# Patient Record
Sex: Female | Born: 1965
Health system: Southern US, Community
[De-identification: ages and names within clinical notes are randomized; demographics above are authoritative.]

## PROBLEM LIST (undated history)

## (undated) DIAGNOSIS — Z800 Family history of malignant neoplasm of digestive organs: Secondary | ICD-10-CM

## (undated) DIAGNOSIS — E7800 Pure hypercholesterolemia, unspecified: Secondary | ICD-10-CM

## (undated) DIAGNOSIS — I10 Essential (primary) hypertension: Secondary | ICD-10-CM

## (undated) DIAGNOSIS — K219 Gastro-esophageal reflux disease without esophagitis: Secondary | ICD-10-CM

## (undated) DIAGNOSIS — I2481 Acute coronary microvascular dysfunction: Secondary | ICD-10-CM

## (undated) DIAGNOSIS — R072 Precordial pain: Secondary | ICD-10-CM

## (undated) DIAGNOSIS — E785 Hyperlipidemia, unspecified: Secondary | ICD-10-CM

## (undated) DIAGNOSIS — Z9889 Other specified postprocedural states: Secondary | ICD-10-CM

## (undated) DIAGNOSIS — R112 Nausea with vomiting, unspecified: Secondary | ICD-10-CM

## (undated) HISTORY — DX: Essential (primary) hypertension: I10

## (undated) HISTORY — PX: TOTAL ABDOMINAL HYSTERECTOMY: SHX209

## (undated) HISTORY — PX: INCONTINENCE SURGERY: SHX676

## (undated) HISTORY — PX: ENDOMETRIAL ABLATION: SHX621

## (undated) HISTORY — PX: TUBAL LIGATION: SHX77

## (undated) HISTORY — DX: Family history of malignant neoplasm of digestive organs: Z80.0

## (undated) HISTORY — DX: Hyperlipidemia, unspecified: E78.5

---

## 1998-02-23 ENCOUNTER — Ambulatory Visit (HOSPITAL_COMMUNITY): Admission: RE | Admit: 1998-02-23 | Discharge: 1998-02-23 | Payer: Self-pay | Admitting: *Deleted

## 1998-03-01 ENCOUNTER — Inpatient Hospital Stay (HOSPITAL_COMMUNITY): Admission: RE | Admit: 1998-03-01 | Discharge: 1998-03-01 | Payer: Self-pay | Admitting: *Deleted

## 2000-01-22 ENCOUNTER — Encounter: Payer: Self-pay | Admitting: Family Medicine

## 2000-01-22 ENCOUNTER — Encounter: Admission: RE | Admit: 2000-01-22 | Discharge: 2000-01-22 | Payer: Self-pay | Admitting: Family Medicine

## 2000-10-12 ENCOUNTER — Encounter: Payer: Self-pay | Admitting: Family Medicine

## 2000-10-12 ENCOUNTER — Encounter: Admission: RE | Admit: 2000-10-12 | Discharge: 2000-10-12 | Payer: Self-pay | Admitting: Family Medicine

## 2000-10-14 ENCOUNTER — Encounter: Payer: Self-pay | Admitting: Family Medicine

## 2000-10-14 ENCOUNTER — Encounter: Admission: RE | Admit: 2000-10-14 | Discharge: 2000-10-14 | Payer: Self-pay | Admitting: Family Medicine

## 2001-04-09 ENCOUNTER — Emergency Department (HOSPITAL_COMMUNITY): Admission: EM | Admit: 2001-04-09 | Discharge: 2001-04-09 | Payer: Self-pay | Admitting: Emergency Medicine

## 2001-04-09 ENCOUNTER — Encounter: Payer: Self-pay | Admitting: Emergency Medicine

## 2001-06-19 ENCOUNTER — Encounter: Payer: Self-pay | Admitting: Neurology

## 2001-06-19 ENCOUNTER — Encounter: Admission: RE | Admit: 2001-06-19 | Discharge: 2001-06-19 | Payer: Self-pay | Admitting: Neurology

## 2003-03-06 ENCOUNTER — Emergency Department (HOSPITAL_COMMUNITY): Admission: EM | Admit: 2003-03-06 | Discharge: 2003-03-06 | Payer: Self-pay | Admitting: *Deleted

## 2003-09-01 ENCOUNTER — Ambulatory Visit (HOSPITAL_COMMUNITY): Admission: RE | Admit: 2003-09-01 | Discharge: 2003-09-01 | Payer: Self-pay | Admitting: Family Medicine

## 2003-09-01 ENCOUNTER — Encounter: Payer: Self-pay | Admitting: Family Medicine

## 2004-01-29 ENCOUNTER — Ambulatory Visit (HOSPITAL_COMMUNITY): Admission: RE | Admit: 2004-01-29 | Discharge: 2004-01-29 | Payer: Self-pay | Admitting: Gastroenterology

## 2005-10-13 ENCOUNTER — Other Ambulatory Visit: Admission: RE | Admit: 2005-10-13 | Discharge: 2005-10-13 | Payer: Self-pay | Admitting: Obstetrics and Gynecology

## 2005-12-01 ENCOUNTER — Encounter (INDEPENDENT_AMBULATORY_CARE_PROVIDER_SITE_OTHER): Payer: Self-pay | Admitting: *Deleted

## 2005-12-01 ENCOUNTER — Inpatient Hospital Stay (HOSPITAL_COMMUNITY): Admission: RE | Admit: 2005-12-01 | Discharge: 2005-12-03 | Payer: Self-pay | Admitting: Obstetrics and Gynecology

## 2007-03-15 ENCOUNTER — Encounter (INDEPENDENT_AMBULATORY_CARE_PROVIDER_SITE_OTHER): Payer: Self-pay | Admitting: *Deleted

## 2007-03-15 ENCOUNTER — Ambulatory Visit (HOSPITAL_COMMUNITY): Admission: RE | Admit: 2007-03-15 | Discharge: 2007-03-16 | Payer: Self-pay | Admitting: Obstetrics and Gynecology

## 2008-07-06 ENCOUNTER — Ambulatory Visit (HOSPITAL_COMMUNITY): Admission: RE | Admit: 2008-07-06 | Discharge: 2008-07-06 | Payer: Self-pay | Admitting: Gastroenterology

## 2011-05-16 NOTE — Op Note (Signed)
NAME:  Tricia Atkins, Tricia Atkins                        ACCOUNT NO.:  [id number]   MEDICAL RECORD NO.:  [id number]                   PATIENT TYPE:  AMB   LOCATION:  ENDO                                 FACILITY:  [facility]   PHYSICIAN:  Anselmo Rod, M.D.               DATE OF BIRTH:  September 01, 1966   DATE OF PROCEDURE:  01/29/2004  DATE OF DISCHARGE:                                 OPERATIVE REPORT   PROCEDURE PERFORMED:  Esophagogastroduodenoscopy.   ENDOSCOPIST:  Charna Elizabeth, M.D.   INSTRUMENT USED:  Olympus video panendoscope.   INDICATIONS FOR PROCEDURE:  Abdominal pain with change in bowel habits in a  45 year old white female with a family history of stomach cancer, rule out  peptic ulcer disease, polyps, masses, etc.   PREPROCEDURE PREPARATION:  Informed consent was procured from the patient.  The patient was fasted for eight hours prior to the procedure.   PREPROCEDURE PHYSICAL:  The patient had stable vital signs.  Neck supple,  chest clear to auscultation.  S1, S2 regular.  Abdomen soft with normal  bowel sounds.   DESCRIPTION OF PROCEDURE:  The patient was placed in the left lateral  decubitus position and sedated with 70 mg of Demerol and 7 mg of Versed  intravenously.  Once the patient was adequately sedated and maintained on  low-flow oxygen and continuous cardiac monitoring, the Olympus video  panendoscope was advanced through the mouth piece over the tongue into the  esophagus under direct vision.  The entire esophagus appeared normal with no  evidence of ring, stricture, masses, esophagitis or Barrett's mucosa.  The  scope was then advanced to the stomach.  The entire gastric mucosa of the  proximal small bowel appeared normal.   IMPRESSION:  Normal esophagogastroduodenoscopy.   RECOMMENDATIONS:  Proceed with colonoscopy at this time, further  recommendation will be made thereafter.                                               Anselmo Rod, M.D.    JNM/MEDQ  D:   01/29/2004  T:  01/29/2004  Job:  [id number]   cc:   Marcelino Duster L. Vincente Poli, M.D.  [address], [address] [place]  [place] [address]  Fax: [phone]   Elvina Sidle, M.D.  [address] [place]  [place] [address]  Fax: [phone]

## 2011-05-16 NOTE — Op Note (Signed)
Tricia Atkins, Tricia Atkins              ACCOUNT NO.:  [id number]   MEDICAL RECORD NO.:  [id number]          PATIENT TYPE:  AMB   LOCATION:  SDC                           [facility]   PHYSICIAN:  Michelle L. Grewal, M.D.DATE OF BIRTH:  1966-02-10   DATE OF PROCEDURE:  03/15/2007  DATE OF DISCHARGE:                               OPERATIVE REPORT   PREOPERATIVE DIAGNOSIS:  Pelvic pain and ovarian cyst.   POSTOPERATIVE DIAGNOSIS:  Pelvic pain and ovarian cyst.   PROCEDURE:  Diagnostic laparoscopy, lysis of adhesions and bilateral  salpingo-oophorectomy.   SURGEON:  Dr. Vincente Poli.   ANESTHESIA:  General.   SPECIMENS:   OVARIES AND TUBES ESTIMATED:  Blood loss was minimal.   COMPLICATIONS:  None.   PROCEDURE:  Patient was taken to the operating room.  She is intubated  without difficulty.  She is prepped and draped in usual sterile fashion.  An in-and-out catheter was used to empty the bladder.  Speculum was  inserted.  A sponge stick was inserted to the vagina.  Attention was  turned to the abdomen, and a small infraumbilical incision was made, and  a Veress needle was inserted.  The pneumoperitoneum was achieved.  The  Veress needle was removed, and the 11-mm trocar was inserted.  The  patient was placed in Trendelenburg position.  The laparoscope was  introduced through the trocar sheath.  A secondary 11-mm trocar was  inserted suprapubically under direct visualization.  Exam of the abdomen  and pelvis revealed a normal appendix, surgically absent uterus.  No  evidence of endometriosis.  She had some adhesions of the omentum to the  vaginal cuff that were then clipped.  Her ovaries had some filmy  adhesions to the pelvic sidewall.  I then identified the left tube and  ovary.  The left ovary was lifted up with a grasper.  The ureter was  well below our IP ligament.  Used the gyrus to burn the IP ligament just  below the ovarian base and carry that all the way across the pedicle.  This was done with no bleeding whatsoever.  This was done in a similar  fashion on the right side as well.  After both tubes and ovaries were  removed, both ureters were noted to be peristalsing normally.  The  Endobag was inserted, and the two surgical specimens were scooped into  the bag, and then this was removed through the suprapubic port.  Entire  bag and specimens were removed.  Tissue was sent to pathology.  No  bleeding was noted whatsoever.  The pneumoperitoneum was released.  The  incisions were closed; the deep layer with a stitch of 0 Vicryl at each  incision site; and the incisions were closed with Dermabond skin  adhesive.  All sponge, lap and instrument counts were correct x2.  The  patient was extubated and went to recovery room in stable condition.      Michelle L. Vincente Poli, M.D.  Electronically Signed     MLG/MEDQ  D:  03/15/2007  T:  03/15/2007  Job:  [id number]

## 2011-05-16 NOTE — Op Note (Signed)
NAMESHALEAH, NISSLEY              ACCOUNT NO.:  [id number]   MEDICAL RECORD NO.:  [id number]          PATIENT TYPE:  INP   LOCATION:  NA                            [facility]   PHYSICIAN:  Michelle L. 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   PROCEDURE:  Total vaginal hysterectomy, anterior and posterior repair.   SURGEON:  Dr. Vincente Poli   ASSISTANT:  Dr. Rana Snare.   ANESTHESIA:  General.   ESTIMATED BLOOD LOSS:  200 mL.   The patient is taken to the operating room.  She is intubated without  difficulty and placed in the lithotomy position.  She is prepped and draped  in the usual sterile fashion, and an in-and-out Foley catheter is inserted.  Exam under anesthesia revealed a grade 2 uterine prolapse, grade 2  cystocele, and grade 2 rectocele.  A weighted speculum was placed in the  vagina.  The cervix was grasped with a single-tooth tenaculum. and a  circumferential incision is made around the cervix.  The overlying vaginal  epithelium is dissected free using Mayo scissors, and the posterior cul-de-  sac is entered sharply.  Using a curved Heaney clamp, uterosacral ligaments  are clamped on either side.  Each pedicle is cut and suture ligated using O  Vicryl suture.  We then entered anteriorly and then with the Metzenbaum  scissors, I worked my way up along the broad ligament using curved Heaney  clamps.  Each pedicle was secured and suture ligated using 0 Vicryl suture.  Once we reached the level of the triple pedicle, the uterus was retroflexed.  The ovaries were noted to be normal.  A curved Heaney clamp was placed  across each triple pedicle.  The specimens were removed using Mayo scissors.  Each  pedicle was secured using both a suture ligature of 0 Vicryl suture and  a free tie of 0 Vicryl suture.  I inspected the pedicles.  Hemostasis was  noted.  We then closed the posterior cuff in a continuous running locked  stitch using 0 Vicryl suture and then closed the posterior two-thirds of the  cuff using 0 Vicryl suture in a continuous running stitch.  At this point,  we decided to do the anterior repair.  I placed Allis clamps at either  corner of the vaginal apex and then using Struhle scissors, I made a midline  incision in the vaginal epithelium just beneath the bladder, almost all the  way to the UV junction.  We then dissected the vesicovaginal fascia free as  overlying vaginal epithelium.  I then closed and reduced the cystocele using  2 pursestring sutures using 0 Vicryl suture, trimmed off the redundant  vaginal epithelium, and closed it using a continuous running locked stitch  using 2-0 Vicryl suture, and it met with our previous suture line.  At this  point,  hemostasis was excellent.  We then placed Allis clamps at the  perineum at 5 and 7 o'clock, made a V-shaped incision, and worked our way  all the way up the posterior vaginal wall using Struhle scissors.  We then  reduced the rectocele, reapproximated the rectovaginal fascia in the midline  using interrupteds using 0 Vicryl suture, starting distally and moving  proximally, trimmed off the excess vaginal epithelium, and closed the  incisions using 2-0 Vicryl suture in a continuous running lock stitch.  At  this point, all sponge, lap, and instrument counts were correct x2.  Hemostasis was excellent.  I scrubbed out as well as Dr. Rana Snare, and Dr.  Sherron Monday, the urologist, scrubbed in, and the remainder of the surgery  will be dictated by him.   PATHOLOGY:  Uterus and cervix.   DRAINS:  Foley and vaginal pack.      Michelle L. Vincente Poli, M.D.  Electronically Signed     MLG/MEDQ  D:  12/01/2005  T:  12/01/2005  Job:   [id number]

## 2011-05-16 NOTE — Op Note (Signed)
[name], Tricia Atkins              ACCOUNT NO.:  [id number]   MEDICAL RECORD NO.:  [id number]          PATIENT TYPE:  INP   LOCATION:  NA                            [facility]   PHYSICIAN:  Martina Sinner, MD DATE OF BIRTH:  May 15, 1966   DATE OF PROCEDURE:  12/01/2005  DATE OF DISCHARGE:                                 OPERATIVE REPORT   PREOPERATIVE DIAGNOSIS:  Mixed stress and urge incontinence.   POSTOPERATIVE DIAGNOSIS:  Mixed stress and urge incontinence.   OPERATION/PROCEDURE:  SPARC sling cystourethropexy plus cystoscopy.   INDICATIONS:  Shine Scrogham has mixed stress urge incontinence.  Her stress  incontinence was problematic.  She underwent a vaginal hysterectomy and A&P  repair by Dr. Vincente Poli.  At the end of the case, I performed a sling  cystourethropexy.   DESCRIPTION OF PROCEDURE:  The patient was in the lithotomy position when I  entered the room.  I initially cystoscoped the patient with the 25-degree  and 70-degree lenses.  There was no distortion of the trigone or ureteral  orifice.  There was indentation in the floor of the bladder which is typical  following an anterior repair.  There was no bladder injury.   Two 1 cm incision were made one fingerbreadth above the symphysis pubis just  lateral to the midline.  A 2.5 cm incision was made through the anterior  vaginal mucosa and pubocervical fascia overlying the urethra, making the  flap quite thick.  I dissected to the urethrovesical angle bilaterally.  There was no bleeding.  With the bladder emptied, I passed a Stamey needle  from above downward on to the pulp of my index finger, staying tight along  the back of the pubic bone.  I scoped the patient.  There was injury to the  bladder.  There was no injury to the bladder or urethra.  I then connected  the SPARC sling in the usual fashion, drawing it up through the retropubic  space.  There was no bleeding.  I cut the sheath below the blue dots.  I  passed saline through the sheath.  I tensioned it in the usual fashion,  removing the sheath.  I was very happy with the looseness and position of  the sling.  I used my tooth pickups and it was loose also to the left and  right of the midline and I could do my rotation maneuver.  The area was  irrigated.  The anterior vaginal wall incision was closed with running 2-0  Vicryl.  I then did two interrupted 2-0 Vicryls and not picking up the  sling.  I closed the abdominal incisions with 4-0 Vicryl and then with  Dermabond.  I put two layers of Dermabond.   I then cystoscoped the patient again.  There was efflux of indigo carmine  from both ureteral orifices.  There was no injury to the bladder or urethra.  Total blood loss was less than 50 mL.  Vaginal packing was inserted.  Foley  catheter was draining urine at the end of the case.  The patient's legs were  taken out of the lithotomy position and she was taken to recovery room.          ______________________________  Martina Sinner, MD  Electronically Signed    SAM/MEDQ  D:  12/01/2005  T:  12/01/2005  Job:  [id number]

## 2011-05-16 NOTE — Op Note (Signed)
NAME:  Tricia Atkins, Tricia Atkins                        ACCOUNT NO.:  [id number]   MEDICAL RECORD NO.:  [id number]                   PATIENT TYPE:  AMB   LOCATION:  ENDO                                 FACILITY:  [facility]   PHYSICIAN:  Anselmo Rod, M.D.               DATE OF BIRTH:  1966-02-17   DATE OF PROCEDURE:  01/29/2004  DATE OF DISCHARGE:                                 OPERATIVE REPORT   PROCEDURE PERFORMED:  Colonoscopy.   ENDOSCOPIST:  Charna Elizabeth, M.D.   INSTRUMENT USED:  Olympus video colonoscope.   INDICATIONS FOR PROCEDURE:  Change in bowel habits, family history of colon  cancer in a 45 year old white female.  Rule out colonic polyps, masses, etc.   PREPROCEDURE PREPARATION:  Informed consent was procured from the patient.  The patient was fasted for eight hours prior to the procedure and prepped  with a bottle of magnesium citrate and a gallon of GoLYTELY the night prior  to the procedure.   PREPROCEDURE PHYSICAL:  The patient had stable vital signs.  Neck supple.  Chest clear to auscultation.  S1 and S2 regular.  Abdomen soft with normal  bowel sounds.   DESCRIPTION OF PROCEDURE:  The patient was placed in left lateral decubitus  position and sedated with an additional 10 mg of Demerol and 1 mg of Versed  intravenously.  Once the patient was adequately sedated and maintained on  low flow oxygen and continuous cardiac monitoring, the Olympus video  colonoscope was advanced from the rectum to the cecum and terminal ileum  without difficulty.  The entire exam was normal.  No masses, polyps,  erosions, ulcerations or diverticula were seen.   IMPRESSION:  Normal colonoscopy up to the terminal ileum.   RECOMMENDATIONS:  1. Continue high fiber diet with liberal fluid intake.  2. Repeat colorectal cancer screening is recommended in the next five years     unless the patient develops any abnormal symptoms in the interim.  3. Outpatient follow-up as need arises in the  future.                                               Anselmo Rod, M.D.    JNM/MEDQ  D:  01/29/2004  T:  01/29/2004  Job:  [id number]   cc:   Marcelino Duster L. Vincente Poli, M.D.  [address], [address] [place]  [place] [address]  Fax: [phone]   Elvina Sidle, M.D.  [address] [place]  [place] [address]  Fax: [phone]

## 2011-05-16 NOTE — Discharge Summary (Signed)
NAMEADRIEANA, FENNELLY              ACCOUNT NO.:  [id number]   MEDICAL RECORD NO.:  [id number]          PATIENT TYPE:  INP   LOCATION:  9320                          [facility]ILITY[facility]   PHYSICIAN:  Michelle L. Grewal, M.D.DATE OF BIRTH:  Apr 26, 1966   DATE OF ADMISSION:  12/01/2005  DATE OF DISCHARGE:  12/03/2005                                 DISCHARGE SUMMARY   ADMITTING DIAGNOSES:  1.  Pelvic relaxation.  2.  Genuine stress urinary incontinence.   HOSPITAL COURSE:  Patient is a 45 year old G2, P2 with pelvic relaxation and  genuine stress urinary incontinence.  On the day of admission she underwent  TVH and anterior and posterior repair.  EBL at the time of surgery is 200  mL.  Dr. McDiarmid did perform a pubovaginal sling and cystoscopy as well.  She did very well postoperatively.  By postoperative day #1 hemoglobin was  9.5, white blood cell count was 8.8.  By postoperative day #2 she had good  pain control, tolerating regular diet and had had flatus and had fairly good  bladder control.  She will be discharged home on ibuprofen 600 mg every six  hours as needed for pain, Tylox one to two every four to six for pain.  She  is advised to follow up in my office in two weeks and she was advised no  driving for two weeks.  She will call me if she has any temperature greater  than 100.5, nausea, vomiting, severe abdominal pain, or bleeding per vagina.      Michelle L. Vincente Poli, M.D.  Electronically Signed     MLG/MEDQ  D:  12/26/2005  T:  12/26/2005  Job:  [id number]

## 2012-12-05 ENCOUNTER — Encounter (HOSPITAL_COMMUNITY): Payer: Self-pay | Admitting: Emergency Medicine

## 2012-12-05 ENCOUNTER — Emergency Department (HOSPITAL_COMMUNITY)
Admission: EM | Admit: 2012-12-05 | Discharge: 2012-12-06 | Disposition: A | Discharge status: Home / Self Care | Payer: Medicare Other | Attending: Emergency Medicine | Admitting: Emergency Medicine

## 2012-12-05 DIAGNOSIS — R61 Generalized hyperhidrosis: Secondary | ICD-10-CM | POA: Insufficient documentation

## 2012-12-05 DIAGNOSIS — R064 Hyperventilation: Secondary | ICD-10-CM | POA: Insufficient documentation

## 2012-12-05 DIAGNOSIS — R112 Nausea with vomiting, unspecified: Secondary | ICD-10-CM | POA: Insufficient documentation

## 2012-12-05 DIAGNOSIS — R109 Unspecified abdominal pain: Secondary | ICD-10-CM | POA: Insufficient documentation

## 2012-12-05 DIAGNOSIS — F411 Generalized anxiety disorder: Secondary | ICD-10-CM | POA: Insufficient documentation

## 2012-12-05 DIAGNOSIS — B9789 Other viral agents as the cause of diseases classified elsewhere: Secondary | ICD-10-CM | POA: Insufficient documentation

## 2012-12-05 DIAGNOSIS — B349 Viral infection, unspecified: Secondary | ICD-10-CM

## 2012-12-05 DIAGNOSIS — Z79899 Other long term (current) drug therapy: Secondary | ICD-10-CM | POA: Insufficient documentation

## 2012-12-05 DIAGNOSIS — R6883 Chills (without fever): Secondary | ICD-10-CM | POA: Insufficient documentation

## 2012-12-05 DIAGNOSIS — R197 Diarrhea, unspecified: Secondary | ICD-10-CM | POA: Insufficient documentation

## 2012-12-05 LAB — POCT I-STAT, CHEM 8
Glucose, Bld: 97 mg/dL (ref 70–99)
HCT: 42 % (ref 36.0–46.0)
Hemoglobin: 14.3 g/dL (ref 12.0–15.0)
Potassium: 4 mEq/L (ref 3.5–5.1)
Sodium: 146 mEq/L — ABNORMAL HIGH (ref 135–145)

## 2012-12-05 MED ORDER — KETOROLAC TROMETHAMINE 30 MG/ML IJ SOLN
30.0000 mg | Freq: Once | INTRAMUSCULAR | Status: AC
  Administered 2012-12-05: 30 mg via INTRAMUSCULAR
  Filled 2012-12-05: qty 1

## 2012-12-05 MED ORDER — METOCLOPRAMIDE HCL 5 MG/ML IJ SOLN
10.0000 mg | Freq: Once | INTRAMUSCULAR | Status: AC
  Administered 2012-12-05: 10 mg via INTRAVENOUS
  Filled 2012-12-05: qty 2

## 2012-12-05 MED ORDER — DIPHENHYDRAMINE HCL 50 MG/ML IJ SOLN
25.0000 mg | Freq: Once | INTRAMUSCULAR | Status: AC
  Administered 2012-12-05: 25 mg via INTRAVENOUS
  Filled 2012-12-05: qty 1

## 2012-12-05 MED ORDER — SODIUM CHLORIDE 0.9 % IV BOLUS (SEPSIS)
1000.0000 mL | Freq: Once | INTRAVENOUS | Status: AC
  Administered 2012-12-05: 1000 mL via INTRAVENOUS

## 2012-12-05 NOTE — ED Notes (Signed)
Pt started hyperventilating while nurse in the room, and started getting anxious. Pt stated that she could not get comfortable, and that she needs someone with her. Pt requested O2. P started at 2L via [place], and pt started to calm down. Pt given a wet washcloth to place over face, and pt became calm again. Pt requests a sitter because she doesn't like to be alone when she is feeling anxious. Charge nurse notified.

## 2012-12-05 NOTE — ED Notes (Signed)
Pt started on 2L O2  Via [place]

## 2012-12-05 NOTE — ED Notes (Signed)
Per EMS, Pt experiencing N/V/D onset approx 2000. Pt is having stomach cramping. Pt feels weak. 20G in L hand. 4mg  of zofran given. States it improved condition. NSR on monitor. Pt states that she was having trouble catching breath. Lung sound clear bilaterally. o2 sats at 98-100% RA. HR 80's Regular. BP 104/62. RR 16.

## 2012-12-05 NOTE — ED Provider Notes (Signed)
History     CSN: [id number]  Arrival date & time 12/05/12  2145   First MD Initiated Contact with Patient 12/05/12 2225      Chief Complaint  Patient presents with  . Nausea  . Emesis  . Diarrhea   HPI  History provided by the patient. Patient is a 46 year old female with history of complete hysterectomy who presents with complaints of acute onset of nausea vomiting and diarrhea. Symptoms began between 7 and 8 PM prior to arrival. Patient reports having several episodes of loose watery diarrhea without blood or mucus. She also reports multiple episodes of nausea and vomiting. Denies any hematemesis. She has some chills and sweats with these episodes. Patient has also developed upper abdominal pains and cramps. 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 She did have a flu shot this year. She denies having any any other associated symptoms. Denies any fever. Denies any dysuria, hematuria, urinary frequency or flank pain. Denies any vaginal bleeding or vaginal discharge. Denies any cough or congestion symptoms.    History reviewed. No pertinent past medical history.  History reviewed. No pertinent past surgical history.  No family history on file.  History  Substance Use Topics  . Smoking status: Not on file  . Smokeless tobacco: Not on file  . Alcohol Use: Not on file    OB History    Grav Para Term Preterm Abortions TAB SAB Ect Mult Living                  Review of Systems  Constitutional: Positive for chills and diaphoresis. Negative for fever.  Respiratory: Negative for cough and shortness of breath.   Cardiovascular: Negative for chest pain.  Gastrointestinal: Positive for nausea, vomiting, abdominal pain and diarrhea. Negative for blood in stool.  Genitourinary: Negative for dysuria, frequency, hematuria, flank pain, vaginal bleeding and vaginal discharge.  All other systems reviewed and are negative.    Allergies  Gluten  meal  Home Medications   Current Outpatient Rx  Name  Route  Sig  Dispense  Refill  . ALPRAZOLAM 0.25 MG PO TABS   Oral   Take 0.125 mg by mouth at bedtime as needed. For anxiety         . SIMVASTATIN 20 MG PO TABS   Oral   Take 20 mg by mouth every evening.         Marland Kitchen ZOLPIDEM TARTRATE 10 MG PO TABS   Oral   Take 10 mg by mouth at bedtime.           BP 102/70  Pulse 76  Temp 98.4 F (36.9 C) (Oral)  Resp 22  SpO2 100%  Physical Exam  Nursing note and vitals reviewed. Constitutional: She is oriented to person, place, and time. She appears well-developed and well-nourished. No distress.  HENT:  Head: Normocephalic.  Mouth/Throat: Oropharynx is clear and moist.  Cardiovascular: Normal rate and regular rhythm.   No murmur heard. Pulmonary/Chest: Effort normal and breath sounds normal. No respiratory distress. She has no wheezes. She has no rales.  Abdominal: Soft. There is tenderness in the epigastric area and left upper quadrant. There is no rebound, no guarding, no CVA tenderness, no tenderness at McBurney's point and negative Murphy's sign.  Neurological: She is alert and oriented to person, place, and time.  Skin: Skin is warm and dry. No rash noted.  Psychiatric: She has a normal mood and affect. Her behavior is normal.  ED Course  Procedures   Results for orders placed during the hospital encounter of 12/05/12  POCT I-STAT, CHEM 8      Component Value Range   Sodium 146 (*) 135 - 145 mEq/L   Potassium 4.0  3.5 - 5.1 mEq/L   Chloride 110  96 - 112 mEq/L   BUN 22  6 - 23 mg/dL   Creatinine, Ser 1.61  0.50 - 1.10 mg/dL   Glucose, Bld 97  70 - 99 mg/dL   Calcium, Ion 0.96 (*) 1.12 - 1.23 mmol/L   TCO2 20  0 - 100 mmol/L   Hemoglobin 14.3  12.0 - 15.0 g/dL   HCT 04.5  40.9 - 81.1 %       1. Nausea vomiting and diarrhea   2. Viral syndrome       MDM  10:50 PM patient seen and evaluated. Patient appears mildly uncomfortable but in no acute  distress.  Patient reports feeling better after IV fluids and medications. She is tolerating by mouth fluids. At this time suspect viral process. We'll discharge home with prescription for Zofran.      Angus Seller, [place] 12/06/12 [phone]

## 2012-12-06 MED ORDER — ONDANSETRON 8 MG PO TBDP: ORAL_TABLET | ORAL | Status: DC

## 2012-12-06 NOTE — ED Provider Notes (Signed)
Medical screening examination/treatment/procedure(s) were performed by non-physician practitioner and as supervising physician I was immediately available for consultation/collaboration.  Doug Sou, MD 12/06/12 [phone]

## 2013-09-23 ENCOUNTER — Other Ambulatory Visit: Payer: Self-pay | Admitting: Obstetrics and Gynecology

## 2013-09-23 DIAGNOSIS — N644 Mastodynia: Secondary | ICD-10-CM

## 2013-10-05 ENCOUNTER — Ambulatory Visit
Admission: RE | Admit: 2013-10-05 | Discharge: 2013-10-05 | Disposition: A | Discharge status: Home / Self Care | Payer: Medicare Other | Source: Ambulatory Visit | Attending: Obstetrics and Gynecology | Admitting: Obstetrics and Gynecology

## 2013-10-05 ENCOUNTER — Ambulatory Visit
Admission: RE | Admit: 2013-10-05 | Discharge: 2013-10-05 | Disposition: A | Discharge status: Home / Self Care | Payer: PRIVATE HEALTH INSURANCE | Source: Ambulatory Visit | Attending: Obstetrics and Gynecology | Admitting: Obstetrics and Gynecology

## 2013-10-05 DIAGNOSIS — N644 Mastodynia: Secondary | ICD-10-CM

## 2016-05-03 DIAGNOSIS — H5203 Hypermetropia, bilateral: Secondary | ICD-10-CM | POA: Diagnosis not present

## 2016-08-07 ENCOUNTER — Other Ambulatory Visit: Payer: Self-pay | Admitting: Nurse Practitioner

## 2016-08-07 DIAGNOSIS — N6311 Unspecified lump in the right breast, upper outer quadrant: Secondary | ICD-10-CM

## 2016-08-07 DIAGNOSIS — N63 Unspecified lump in breast: Secondary | ICD-10-CM | POA: Diagnosis not present

## 2016-08-12 ENCOUNTER — Ambulatory Visit
Admission: RE | Admit: 2016-08-12 | Discharge: 2016-08-12 | Disposition: A | Discharge status: Home / Self Care | Payer: Self-pay | Source: Ambulatory Visit | Attending: Nurse Practitioner | Admitting: Nurse Practitioner

## 2016-08-12 ENCOUNTER — Ambulatory Visit
Admission: RE | Admit: 2016-08-12 | Discharge: 2016-08-12 | Disposition: A | Discharge status: Home / Self Care | Payer: 59 | Source: Ambulatory Visit | Attending: Nurse Practitioner | Admitting: Nurse Practitioner

## 2016-08-12 DIAGNOSIS — N6311 Unspecified lump in the right breast, upper outer quadrant: Secondary | ICD-10-CM

## 2016-08-12 DIAGNOSIS — R922 Inconclusive mammogram: Secondary | ICD-10-CM | POA: Diagnosis not present

## 2016-08-12 DIAGNOSIS — N6489 Other specified disorders of breast: Secondary | ICD-10-CM | POA: Diagnosis not present

## 2016-10-02 DIAGNOSIS — E785 Hyperlipidemia, unspecified: Secondary | ICD-10-CM | POA: Diagnosis not present

## 2016-10-02 DIAGNOSIS — L255 Unspecified contact dermatitis due to plants, except food: Secondary | ICD-10-CM | POA: Diagnosis not present

## 2016-10-24 DIAGNOSIS — L280 Lichen simplex chronicus: Secondary | ICD-10-CM | POA: Diagnosis not present

## 2016-10-24 DIAGNOSIS — D239 Other benign neoplasm of skin, unspecified: Secondary | ICD-10-CM | POA: Diagnosis not present

## 2016-10-24 DIAGNOSIS — L089 Local infection of the skin and subcutaneous tissue, unspecified: Secondary | ICD-10-CM | POA: Diagnosis not present

## 2016-11-03 MED FILL — ROSUVASTATIN CALCIUM 10 MG: 10 | 90 days supply | Qty: 90 | Fill #0

## 2017-01-13 DIAGNOSIS — H5203 Hypermetropia, bilateral: Secondary | ICD-10-CM | POA: Diagnosis not present

## 2017-03-04 MED FILL — ROSUVASTATIN CALCIUM 10 MG: 10 | 90 days supply | Qty: 90 | Fill #1

## 2017-04-30 DIAGNOSIS — H53421 Scotoma of blind spot area, right eye: Secondary | ICD-10-CM | POA: Diagnosis not present

## 2017-06-29 ENCOUNTER — Other Ambulatory Visit: Payer: Self-pay | Admitting: Nurse Practitioner

## 2017-06-29 DIAGNOSIS — Z1231 Encounter for screening mammogram for malignant neoplasm of breast: Secondary | ICD-10-CM

## 2017-08-20 ENCOUNTER — Ambulatory Visit
Admission: RE | Admit: 2017-08-20 | Discharge: 2017-08-20 | Disposition: A | Discharge status: Home / Self Care | Payer: 59 | Source: Ambulatory Visit | Attending: Nurse Practitioner | Admitting: Nurse Practitioner

## 2017-08-20 DIAGNOSIS — Z1231 Encounter for screening mammogram for malignant neoplasm of breast: Secondary | ICD-10-CM | POA: Diagnosis not present

## 2017-08-28 DIAGNOSIS — Z0000 Encounter for general adult medical examination without abnormal findings: Secondary | ICD-10-CM | POA: Diagnosis not present

## 2017-08-28 DIAGNOSIS — Z13228 Encounter for screening for other metabolic disorders: Secondary | ICD-10-CM | POA: Diagnosis not present

## 2017-08-28 DIAGNOSIS — Z111 Encounter for screening for respiratory tuberculosis: Secondary | ICD-10-CM | POA: Diagnosis not present

## 2017-08-28 DIAGNOSIS — E785 Hyperlipidemia, unspecified: Secondary | ICD-10-CM | POA: Diagnosis not present

## 2017-08-28 MED FILL — ROSUVASTATIN CALCIUM 10 MG: 10 | 90 days supply | Qty: 90 | Fill #0

## 2019-03-18 ENCOUNTER — Telehealth: Payer: Self-pay | Admitting: Family

## 2019-03-18 DIAGNOSIS — B9689 Other specified bacterial agents as the cause of diseases classified elsewhere: Secondary | ICD-10-CM

## 2019-03-18 DIAGNOSIS — J028 Acute pharyngitis due to other specified organisms: Principal | ICD-10-CM

## 2019-03-18 MED ORDER — AMOXICILLIN-POT CLAVULANATE 875-125 MG PO TABS: 1.0000 | ORAL_TABLET | Freq: Two times a day (BID) | ORAL | 0 refills | Status: DC

## 2019-03-18 NOTE — Progress Notes (Signed)

## 2019-11-22 NOTE — Progress Notes (Signed)
Greater than 5 minutes, yet less than 10 minutes of time have been spent researching, coordinating, and implementing care for this patient today.  Thank you for the details you included in the comment boxes. Those details are very helpful in determining the best course of treatment for you and help us to provide the best care.  

## 2022-02-05 NOTE — Progress Notes (Signed)
Patient referred by Everardo Beals, NP for chest pain  Subjective:   Tricia Atkins, female    DOB: 03/11/1966, 56 y.o.   MRN: [id number]   Chief Complaint  Patient presents with   Chest Pain     HPI  56 y.o. Caucasian female with controlled hyperlipidemia, family history of early CAD, referred for evaluation of chest pain  Patient is here with her mother today.  Patient is a Equities trader, works as a Transport planner at [employer].  She is strong family history with her father having had fatal MI at age 22.  Patient has been experiencing left-sided "sharp twinge" like pain episodes lasting for anywhere from 2 minutes to 15 minutes.  Episodes usually occur at rest, or unrelated to physical activity.  They happen several times a day.  There are no specific aggravating or relieving factors.  Patient wonders if these episodes could be related to coronary vasospasm.   Past Medical History:  Diagnosis Date   Hyperlipidemia      Past Surgical History:  Procedure Laterality Date   INCONTINENCE SURGERY     TOTAL ABDOMINAL HYSTERECTOMY       Social History   Tobacco Use  Smoking Status Never  Smokeless Tobacco Never    Social History   Substance and Sexual Activity  Alcohol Use None     Family History  Problem Relation Age of Onset   Hyperlipidemia Mother    Hypertension Mother    Heart attack Father    CAD Father    Hypertension Brother    Breast cancer Maternal Grandmother      Current Outpatient Medications on File Prior to Visit  Medication Sig Dispense Refill   acetaminophen (TYLENOL) 500 MG tablet Take 500 mg by mouth every 6 (six) hours as needed.     rosuvastatin (CRESTOR) 20 MG tablet Take 20 mg by mouth daily.     No current facility-administered medications on file prior to visit.    Cardiovascular and other pertinent studies:  EKG 01/27/2022:  Sinus rhythm 60 bpm Normal EKG     Recent labs: 01/27/2022: Glucose 84, BUN/Cr  19/0.96. EGFR 71. Na/K 142/4.1. Rest of the CMP normal Chol 147, TG 107, HDL 43, LDL 84   Review of Systems  Cardiovascular:  Positive for chest pain. Negative for dyspnea on exertion, leg swelling, palpitations and syncope.        Vitals:   02/06/22 0838  BP: 118/77  Pulse: 69  Temp: 98 F (36.7 C)  SpO2: 96%     Body mass index is 26.54 kg/m. Filed Weights   02/06/22 0838  Weight: 151 lb (68.5 kg)     Objective:   Physical Exam Vitals and nursing note reviewed.  Constitutional:      General: She is not in acute distress. Neck:     Vascular: No JVD.  Cardiovascular:     Rate and Rhythm: Normal rate and regular rhythm.     Heart sounds: Normal heart sounds. No murmur heard. Pulmonary:     Effort: Pulmonary effort is normal.     Breath sounds: Normal breath sounds. No wheezing or rales.  Musculoskeletal:     Right lower leg: No edema.     Left lower leg: No edema.        Assessment & Recommendations:   56 y.o. Caucasian female with controlled hyperlipidemia, family history of early CAD, referred for evaluation of chest pain  Chest pain: Unlikely to be exertional  angina.  Coronary vasospasm is rare but distinct possibility. Recommend exercise treadmill stress test, CT cardiac scoring. Prescribed nitroglycerin for as needed use.  Mixed hyperlipidemia: Controlled  Further recommendations after above testing   Thank you for referring the patient to [place]. Please feel free to contact with any questions.   Nigel Mormon, MD Pager: [phone] Office: [phone]

## 2022-02-06 ENCOUNTER — Other Ambulatory Visit: Payer: Self-pay

## 2022-02-06 ENCOUNTER — Encounter: Payer: Self-pay | Admitting: Cardiology

## 2022-02-06 ENCOUNTER — Ambulatory Visit: Payer: 59 | Admitting: Cardiology

## 2022-02-06 VITALS — BP 118/77 | HR 69 | Temp 98.0°F | Ht 63.25 in | Wt 151.0 lb

## 2022-02-06 DIAGNOSIS — R072 Precordial pain: Secondary | ICD-10-CM | POA: Insufficient documentation

## 2022-02-06 DIAGNOSIS — E782 Mixed hyperlipidemia: Secondary | ICD-10-CM | POA: Insufficient documentation

## 2022-02-06 MED ORDER — NITROGLYCERIN 0.4 MG SL SUBL: 0.4000 mg | SUBLINGUAL_TABLET | SUBLINGUAL | 3 refills | Status: DC | PRN

## 2022-02-28 ENCOUNTER — Ambulatory Visit: Payer: 59

## 2022-02-28 ENCOUNTER — Other Ambulatory Visit: Payer: Self-pay

## 2022-02-28 DIAGNOSIS — R072 Precordial pain: Secondary | ICD-10-CM

## 2022-03-07 ENCOUNTER — Ambulatory Visit: Payer: 59 | Admitting: Cardiology

## 2022-03-10 ENCOUNTER — Ambulatory Visit
Admission: RE | Admit: 2022-03-10 | Discharge: 2022-03-10 | Disposition: A | Discharge status: Home / Self Care | Payer: Self-pay | Source: Ambulatory Visit | Attending: Cardiology | Admitting: Cardiology

## 2022-03-10 DIAGNOSIS — R072 Precordial pain: Secondary | ICD-10-CM

## 2022-03-16 NOTE — Progress Notes (Signed)
? ? ?Patient referred by Everardo Beals, NP for chest pain ? ?Subjective:  ? ?Tricia Atkins, female    DOB: 1966/06/03, 56 y.o.   MRN: [id number] ? ? ?Chief Complaint  ?Patient presents with  ? Chest Pain  ? Follow-up  ? Results  ? ? ? ?HPI ? ?56 y.o. Caucasian female with controlled hyperlipidemia, family history of early CAD, referred for evaluation of chest pain ? ?Exercise treadmill stress test showed excellent exercise capacity, equivocal EKG changes.  CT cardiac scoring shows 0 calcium.  Patient still has episodes of chest pain from time to time.  Recently, she pulled a heavy rug, following which she had chest pain for about 10-15-minute, resolved on its own.  Patient is very apprehensive about using nitroglycerin, with fear of causing headache and hypertension. ? ?Initial consultation visit 12/2021: ?Patient is here with her mother today.  Patient is a Equities trader, works as a Transport planner at [employer].  She is strong family history with her father having had fatal MI at age 43.  Patient has been experiencing left-sided "sharp twinge" like pain episodes lasting for anywhere from 2 minutes to 15 minutes.  Episodes usually occur at rest, or unrelated to physical activity.  They happen several times a day.  There are no specific aggravating or relieving factors.  Patient wonders if these episodes could be related to coronary vasospasm. ? ? ? ?Current Outpatient Medications:  ?  acetaminophen (TYLENOL) 500 MG tablet, Take 500 mg by mouth every 6 (six) hours as needed., Disp: , Rfl:  ?  nitroGLYCERIN (NITROSTAT) 0.4 MG SL tablet, Place 1 tablet (0.4 mg total) under the tongue every 5 (five) minutes as needed for chest pain., Disp: 25 tablet, Rfl: 3 ?  rosuvastatin (CRESTOR) 20 MG tablet, Take 20 mg by mouth daily., Disp: , Rfl:  ? ?Cardiovascular and other pertinent studies: ? ?CT cardiac scoring 03/10/2022: ?Calcium score 0 ? ?Exercise treadmill stress test 02/28/2021: ?Functional status: Good. ?Chest pain:  No. ?Reason for stopping exercise: Maximum exercise capacity. ?Hypertensive response to exercise: No. ?Exercise time 9 minutes 17 seconds on Bruce protocol, achieved 10.6 METS, 100% of age-predicted maximum heart rate.  ?Stress ECG positive for ischemia.  ?Good functional capacity for age but ST depression in inferolateral leads persist more than 3 minutes into recovery. ?Clinical correlation warranted, consider further cardiac work-up. ?  ? ?EKG 01/27/2022:  ?Sinus rhythm 60 bpm ?Normal EKG ? ? ? ? ?Recent labs: ?01/27/2022: ?Glucose 84, BUN/Cr 19/0.96. EGFR 71. Na/K 142/4.1. Rest of the CMP normal ?Chol 147, TG 107, HDL 43, LDL 84 ? ? ?Review of Systems  ?Cardiovascular:  Positive for chest pain. Negative for dyspnea on exertion, leg swelling, palpitations and syncope.  ? ?   ? ? ?Vitals:  ? 03/17/22 0834  ?BP: 116/75  ?Pulse: 67  ?Resp: 16  ?Temp: (!) 97.5 ?F (36.4 ?C)  ?SpO2: 98%  ? ? ? ?Body mass index is 26.75 kg/m?. Danley Danker Weights  ? 03/17/22 0834  ?Weight: 151 lb (68.5 kg)  ? ? ? ?Objective:  ? Physical Exam ?Vitals and nursing note reviewed.  ?Constitutional:   ?   General: She is not in acute distress. ?Neck:  ?   Vascular: No JVD.  ?Cardiovascular:  ?   Rate and Rhythm: Normal rate and regular rhythm.  ?   Heart sounds: Normal heart sounds. No murmur heard. ?Pulmonary:  ?   Effort: Pulmonary effort is normal.  ?   Breath sounds: Normal breath sounds.  No wheezing or rales.  ?Musculoskeletal:  ?   Right lower leg: No edema.  ?   Left lower leg: No edema.  ? ? ?   ?  ICD-10-CM   ?1. Precordial pain  R07.2   ?  ?2. Mixed hyperlipidemia  E78.2   ?  ? ?Meds ordered this encounter  ?Medications  ? amLODipine (NORVASC) 2.5 MG tablet  ?  Sig: Take 1 tablet (2.5 mg total) by mouth daily.  ?  Dispense:  30 tablet  ?  Refill:  3  ? ? ? ?Assessment & Recommendations:  ? ?56 y.o. Caucasian female with controlled hyperlipidemia, family history of early CAD, referred for evaluation of chest pain ? ?Chest pain: ?Symptoms  are concerning for angina.  However stress EKG is equally vocal at worst, calcium score 0.  Suspicion of epicardial coronary artery disease is very low.  Coronary vasospasm spasm, or microvascular disease remains a possibility.  We discussed further work-up with CT angiogram or coronary angiogram, versus empirical medical therapy.  Patient prefers latter.  She prefers not to be on nitrate.  Therefore, I started her on amlodipine 2.5 mg daily. ? ?Mixed hyperlipidemia: ?Controlled ? ?F/u in 3 months  ? ? ?Nigel Mormon, MD ?Pager: [phone] ?Office: [phone]  ?

## 2022-03-17 ENCOUNTER — Ambulatory Visit: Payer: 59 | Admitting: Cardiology

## 2022-03-17 ENCOUNTER — Other Ambulatory Visit: Payer: Self-pay

## 2022-03-17 ENCOUNTER — Encounter: Payer: Self-pay | Admitting: Cardiology

## 2022-03-17 VITALS — BP 116/75 | HR 67 | Temp 97.5°F | Resp 16 | Ht 63.0 in | Wt 151.0 lb

## 2022-03-17 DIAGNOSIS — E782 Mixed hyperlipidemia: Secondary | ICD-10-CM

## 2022-03-17 DIAGNOSIS — R072 Precordial pain: Secondary | ICD-10-CM

## 2022-03-17 MED ORDER — AMLODIPINE BESYLATE 2.5 MG PO TABS: 2.5000 mg | ORAL_TABLET | Freq: Every day | ORAL | 3 refills | Status: DC

## 2022-06-20 ENCOUNTER — Ambulatory Visit: Payer: 59 | Admitting: Cardiology

## 2022-06-20 ENCOUNTER — Encounter: Payer: Self-pay | Admitting: Cardiology

## 2022-06-20 VITALS — BP 99/67 | HR 67 | Temp 98.2°F | Resp 16 | Ht 63.0 in | Wt 148.0 lb

## 2022-06-20 DIAGNOSIS — K219 Gastro-esophageal reflux disease without esophagitis: Secondary | ICD-10-CM | POA: Insufficient documentation

## 2022-06-20 DIAGNOSIS — R072 Precordial pain: Secondary | ICD-10-CM

## 2022-06-20 DIAGNOSIS — E782 Mixed hyperlipidemia: Secondary | ICD-10-CM

## 2022-06-20 MED ORDER — OMEPRAZOLE 10 MG PO CPDR: 10.0000 mg | DELAYED_RELEASE_CAPSULE | Freq: Every day | ORAL | 1 refills | Status: AC

## 2022-07-10 ENCOUNTER — Other Ambulatory Visit: Payer: Self-pay | Admitting: Cardiology

## 2022-07-10 DIAGNOSIS — R072 Precordial pain: Secondary | ICD-10-CM

## 2022-10-31 ENCOUNTER — Other Ambulatory Visit: Payer: Self-pay | Admitting: Cardiology

## 2022-10-31 DIAGNOSIS — R072 Precordial pain: Secondary | ICD-10-CM

## 2022-12-24 IMAGING — CT CT CARDIAC CORONARY ARTERY CALCIUM SCORE
3 series · 12 of 20 positions shown, 14 images · non-contrast
Comparison: None.

CLINICAL DATA: A 55-year-old white female presents for evaluation
of coronary artery calcium in the setting of family history of
cardiac disease and personal history of hyperlipidemia

EXAM:
CT CARDIAC CORONARY ARTERY CALCIUM SCORE
TECHNIQUE: Non-contrast imaging through the heart was performed using
prospective ECG gating. Image post processing was performed on an
independent workstation, allowing for quantitative analysis of the
heart and coronary arteries. Note that this exam targets the heart
and the chest was not imaged in its entirety.

[Series 2: calcium scoring 2.00 qr36 bestdiast 69% hrt calciu · axial · 0.33mm/px · z∈[+1531,+1559]mm · 2 of 70 slices shown]
[im 14/70  vessel]
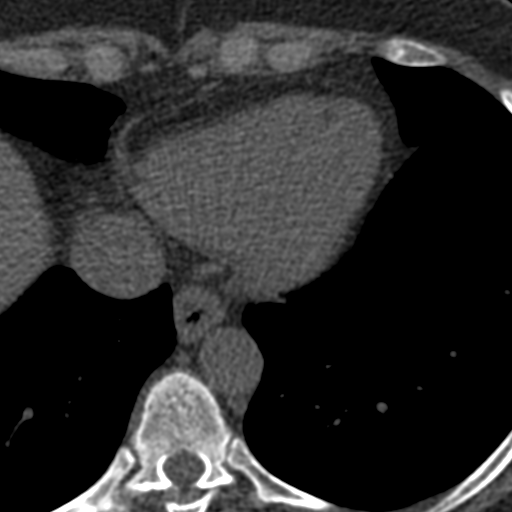
[im 28/70  vessel]
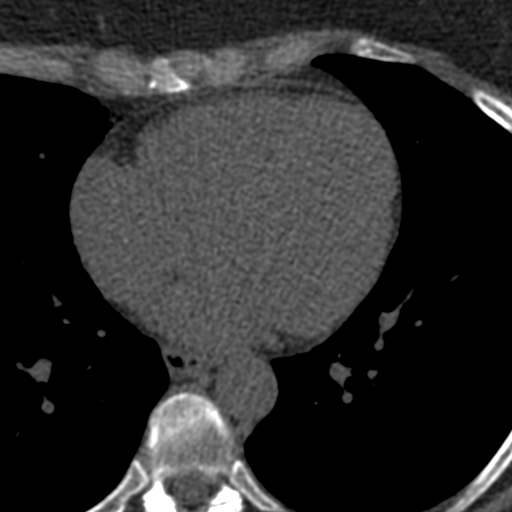

[Series 3: calcium scoring 2.00 br40 bestdiast 69% axial · axial · 0.50mm/px · z∈[+1527,+1619]mm · 5 of 70 slices shown, 7 images]
[im 12/70  vessel]
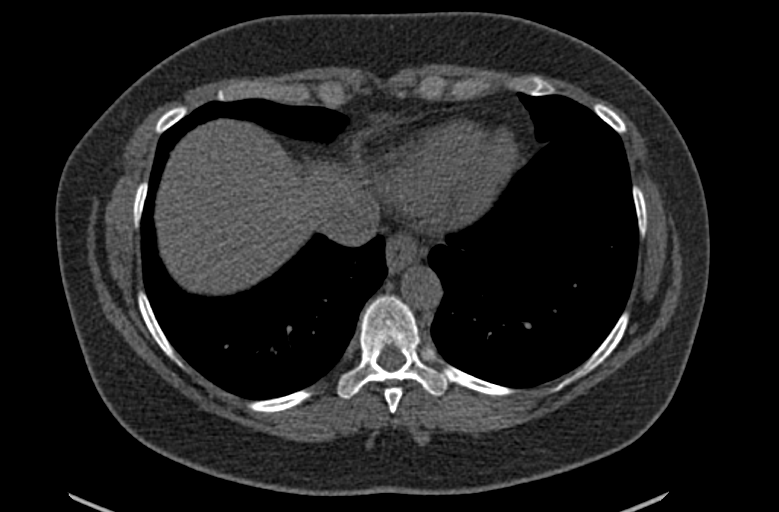
[im 12/70  lung]
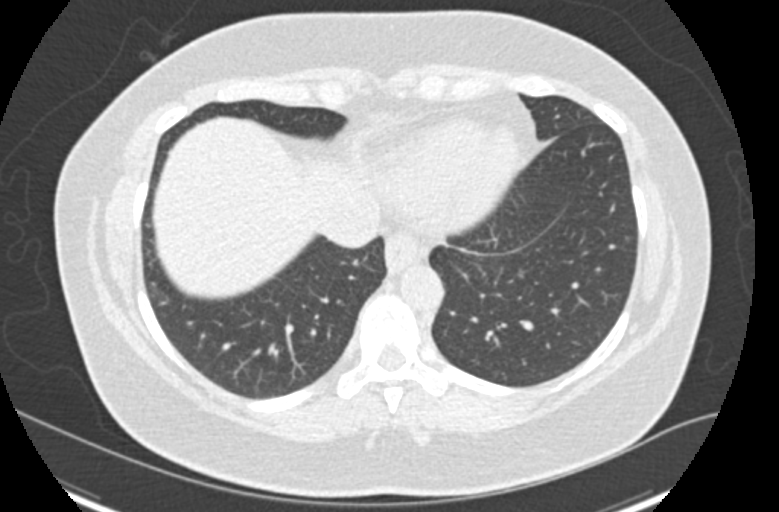
[im 24/70  vessel]
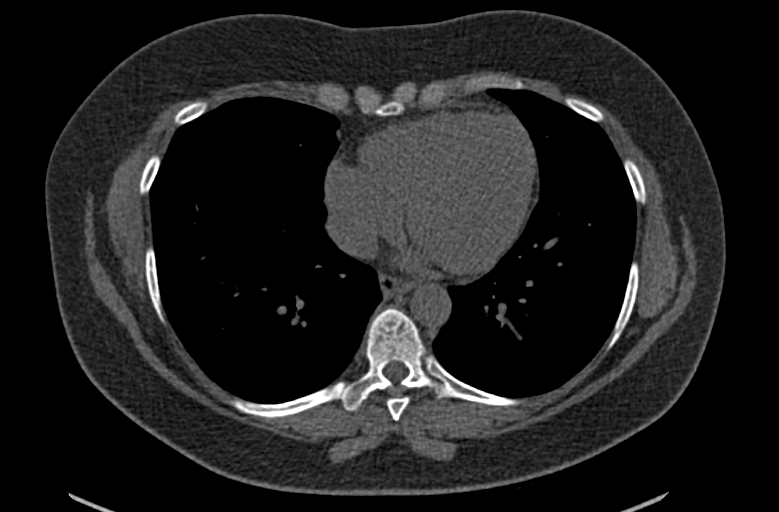
[im 35/70  vessel]
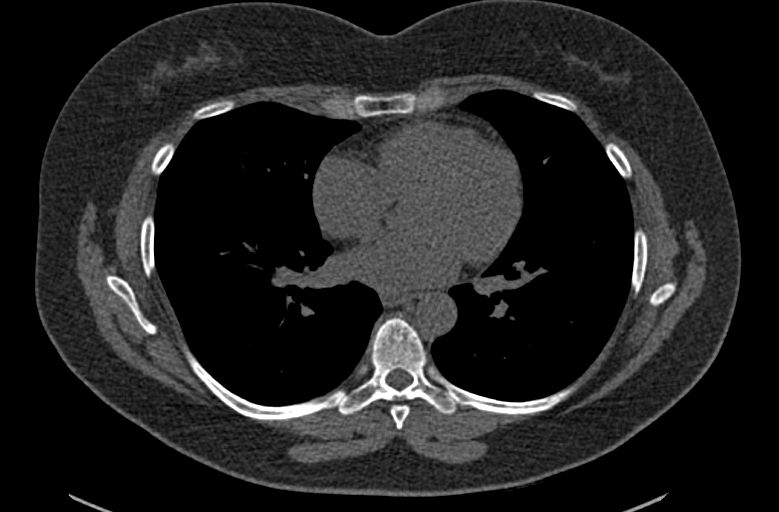
[im 47/70  vessel]
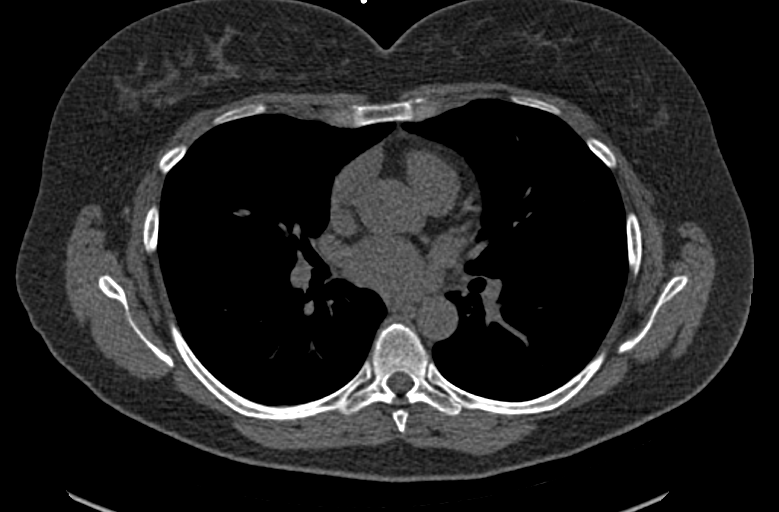
[im 58/70  vessel]
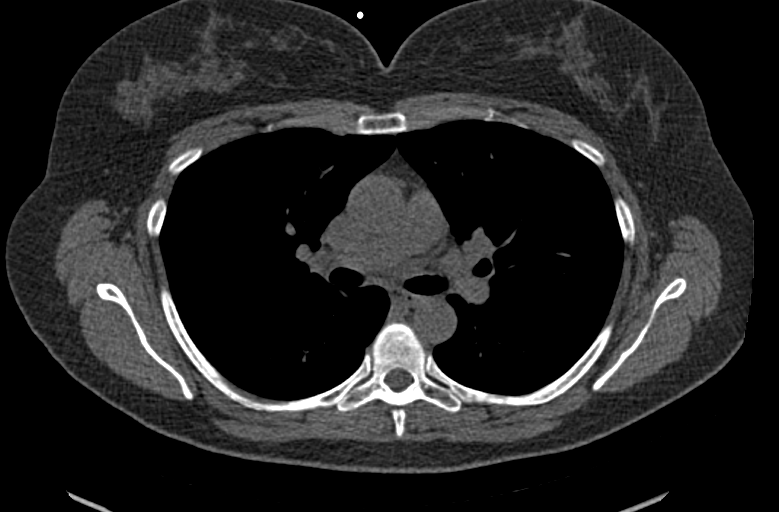
[im 58/70  lung]
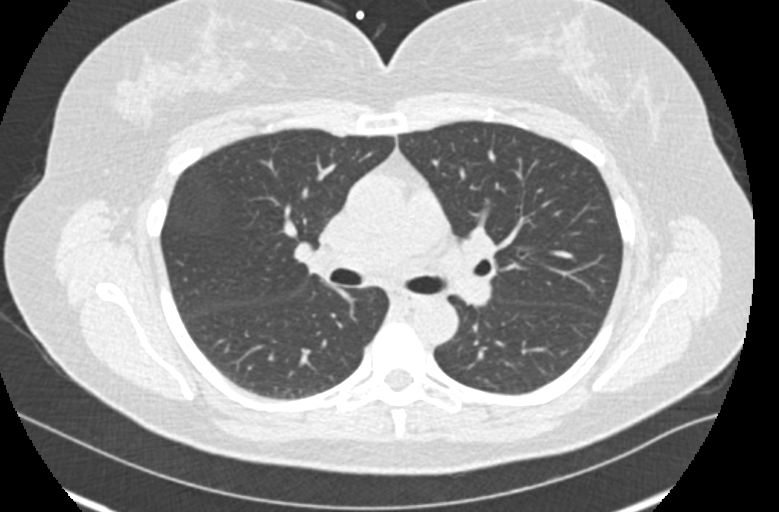

[Series 9: calcium scoring 2.00 br60 bestdiast 69% lungs · axial · 0.50mm/px · z∈[+1527,+1619]mm · 5 of 70 slices shown]
[im 12/70  vessel]
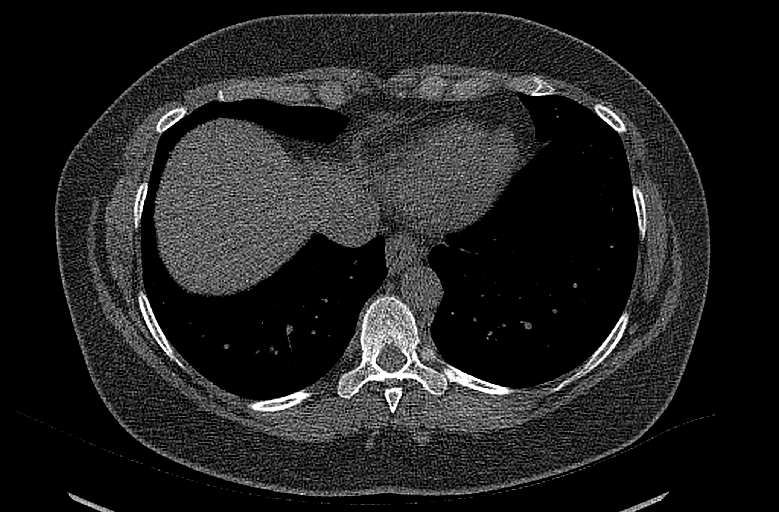
[im 24/70  vessel]
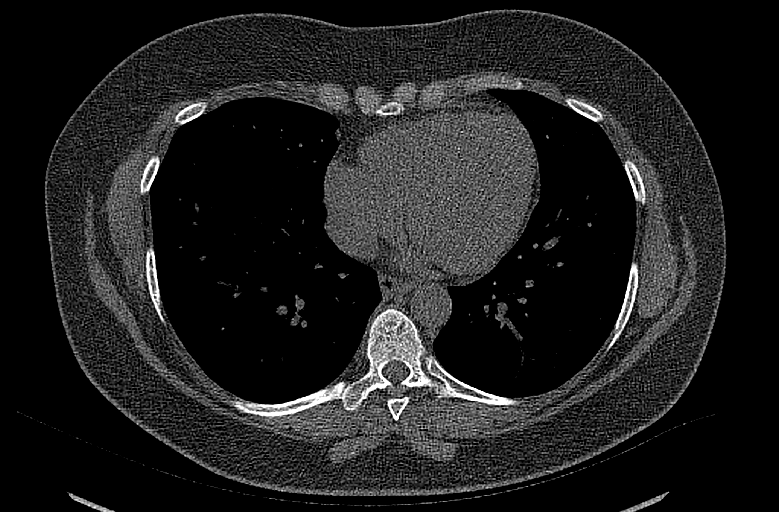
[im 35/70  vessel]
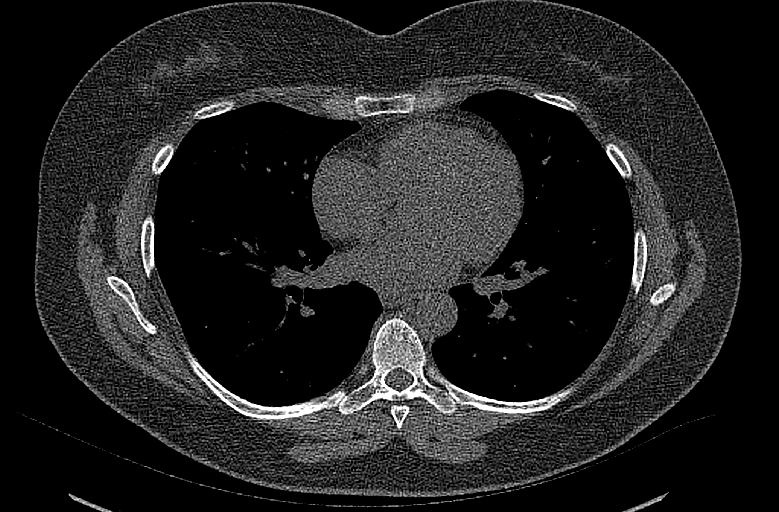
[im 47/70  vessel]
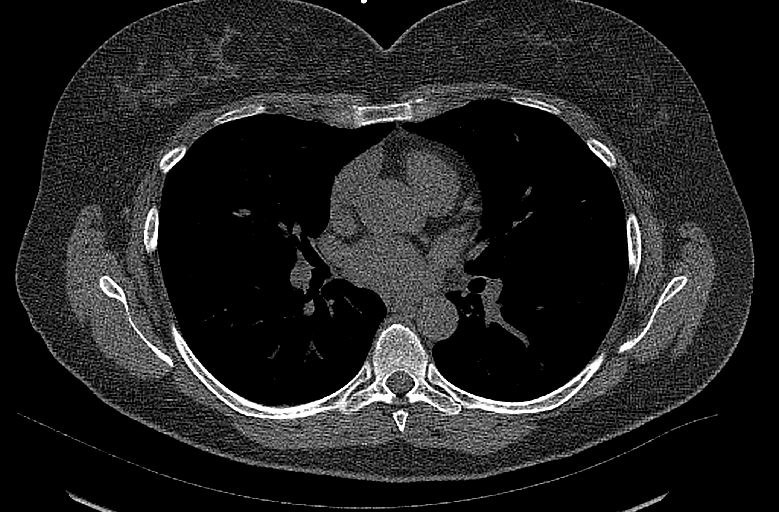
[im 58/70  vessel]
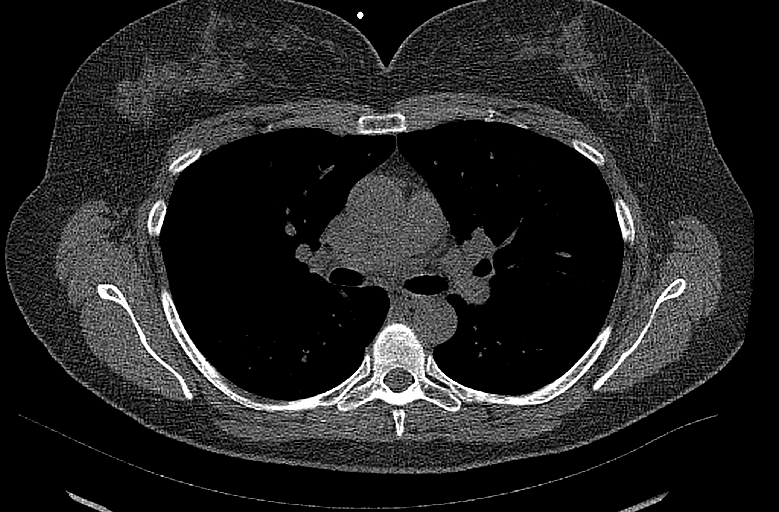

[12 of 20 positions shown; findings below may reference images not displayed]

FINDINGS: CORONARY CALCIUM SCORES:

Left Main: 0

LAD: 0

LCx: 0

RCA: 0

Total Agatston Score: 0

[HOSPITAL] percentile: 0

AORTA MEASUREMENTS:

Ascending Aorta: 28 mm

Descending Aorta: 21 mm

OTHER FINDINGS:

Cardiovascular: No pericardial effusion. Heart size is normal.
Central pulmonary vasculature is unremarkable in terms of caliber.
Limited assessment of cardiovascular structures given lack of
intravenous contrast.

Mediastinum/Nodes: No adenopathy in the visualized portions of the
chest.

Lungs/Pleura: Visualized lungs are clear. Visualized airways are
patent.

Upper Abdomen: No acute findings to the extent evaluated.

Musculoskeletal: Normal.
IMPRESSION: No signs of coronary artery calcium.

## 2022-12-26 ENCOUNTER — Ambulatory Visit: Payer: Self-pay | Admitting: Cardiology

## 2023-01-02 ENCOUNTER — Ambulatory Visit: Payer: Self-pay | Admitting: Cardiology

## 2023-02-03 ENCOUNTER — Other Ambulatory Visit (HOSPITAL_COMMUNITY): Payer: Self-pay

## 2023-02-03 DIAGNOSIS — M7672 Peroneal tendinitis, left leg: Secondary | ICD-10-CM | POA: Diagnosis not present

## 2023-02-03 DIAGNOSIS — S93492A Sprain of other ligament of left ankle, initial encounter: Secondary | ICD-10-CM | POA: Diagnosis not present

## 2023-02-03 DIAGNOSIS — M25572 Pain in left ankle and joints of left foot: Secondary | ICD-10-CM | POA: Diagnosis not present

## 2023-02-03 MED ORDER — MELOXICAM 7.5 MG PO TABS
7.5000 mg | ORAL_TABLET | ORAL | 0 refills | Status: DC | PRN
  Filled 2023-02-03: qty 20, 20d supply, fill #0

## 2023-02-04 ENCOUNTER — Encounter: Payer: 59 | Admitting: Nurse Practitioner

## 2023-02-04 ENCOUNTER — Other Ambulatory Visit: Payer: Self-pay | Admitting: Family

## 2023-02-04 DIAGNOSIS — N6459 Other signs and symptoms in breast: Secondary | ICD-10-CM | POA: Diagnosis not present

## 2023-02-04 DIAGNOSIS — N6452 Nipple discharge: Secondary | ICD-10-CM | POA: Diagnosis not present

## 2023-02-04 DIAGNOSIS — Z6827 Body mass index (BMI) 27.0-27.9, adult: Secondary | ICD-10-CM | POA: Diagnosis not present

## 2023-02-04 DIAGNOSIS — Z01419 Encounter for gynecological examination (general) (routine) without abnormal findings: Secondary | ICD-10-CM | POA: Diagnosis not present

## 2023-02-04 DIAGNOSIS — Z7689 Persons encountering health services in other specified circumstances: Secondary | ICD-10-CM | POA: Diagnosis not present

## 2023-02-04 DIAGNOSIS — N6002 Solitary cyst of left breast: Secondary | ICD-10-CM

## 2023-02-05 ENCOUNTER — Other Ambulatory Visit (HOSPITAL_BASED_OUTPATIENT_CLINIC_OR_DEPARTMENT_OTHER): Payer: Self-pay

## 2023-02-09 ENCOUNTER — Other Ambulatory Visit (HOSPITAL_COMMUNITY): Payer: Self-pay

## 2023-02-09 DIAGNOSIS — Z13820 Encounter for screening for osteoporosis: Secondary | ICD-10-CM | POA: Diagnosis not present

## 2023-02-12 ENCOUNTER — Other Ambulatory Visit (HOSPITAL_COMMUNITY): Payer: Self-pay

## 2023-02-12 DIAGNOSIS — M7672 Peroneal tendinitis, left leg: Secondary | ICD-10-CM | POA: Diagnosis not present

## 2023-02-12 MED ORDER — CELECOXIB 100 MG PO CAPS
100.0000 mg | ORAL_CAPSULE | Freq: Every day | ORAL | 0 refills | Status: DC | PRN
  Filled 2023-02-12: qty 10, 10d supply, fill #0

## 2023-02-13 DIAGNOSIS — S93492A Sprain of other ligament of left ankle, initial encounter: Secondary | ICD-10-CM | POA: Diagnosis not present

## 2023-02-13 DIAGNOSIS — M25572 Pain in left ankle and joints of left foot: Secondary | ICD-10-CM | POA: Diagnosis not present

## 2023-02-16 ENCOUNTER — Other Ambulatory Visit (HOSPITAL_COMMUNITY): Payer: Self-pay

## 2023-02-17 DIAGNOSIS — M25572 Pain in left ankle and joints of left foot: Secondary | ICD-10-CM | POA: Diagnosis not present

## 2023-02-25 ENCOUNTER — Ambulatory Visit
Admission: RE | Admit: 2023-02-25 | Discharge: 2023-02-25 | Disposition: A | Discharge status: Home / Self Care | Payer: No Typology Code available for payment source | Source: Ambulatory Visit | Attending: Family | Admitting: Family

## 2023-02-25 ENCOUNTER — Other Ambulatory Visit: Payer: Self-pay | Admitting: Family

## 2023-02-25 ENCOUNTER — Ambulatory Visit: Payer: No Typology Code available for payment source

## 2023-02-25 ENCOUNTER — Ambulatory Visit
Admission: RE | Admit: 2023-02-25 | Discharge: 2023-02-25 | Disposition: A | Discharge status: Home / Self Care | Payer: 59 | Source: Ambulatory Visit | Attending: Family | Admitting: Family

## 2023-02-25 DIAGNOSIS — N6459 Other signs and symptoms in breast: Secondary | ICD-10-CM

## 2023-02-25 DIAGNOSIS — R922 Inconclusive mammogram: Secondary | ICD-10-CM | POA: Diagnosis not present

## 2023-02-25 DIAGNOSIS — N6452 Nipple discharge: Secondary | ICD-10-CM

## 2023-02-25 DIAGNOSIS — N6310 Unspecified lump in the right breast, unspecified quadrant: Secondary | ICD-10-CM

## 2023-02-27 DIAGNOSIS — M25572 Pain in left ankle and joints of left foot: Secondary | ICD-10-CM | POA: Diagnosis not present

## 2023-03-02 ENCOUNTER — Other Ambulatory Visit (HOSPITAL_COMMUNITY): Payer: Self-pay

## 2023-03-03 ENCOUNTER — Ambulatory Visit
Admission: RE | Admit: 2023-03-03 | Discharge: 2023-03-03 | Disposition: A | Discharge status: Home / Self Care | Payer: 59 | Source: Ambulatory Visit | Attending: Family | Admitting: Family

## 2023-03-03 DIAGNOSIS — N6341 Unspecified lump in right breast, subareolar: Secondary | ICD-10-CM | POA: Diagnosis not present

## 2023-03-03 DIAGNOSIS — N6452 Nipple discharge: Secondary | ICD-10-CM

## 2023-03-03 DIAGNOSIS — N6310 Unspecified lump in the right breast, unspecified quadrant: Secondary | ICD-10-CM

## 2023-03-03 DIAGNOSIS — N6459 Other signs and symptoms in breast: Secondary | ICD-10-CM

## 2023-03-03 HISTORY — PX: BREAST BIOPSY: SHX20

## 2023-03-04 ENCOUNTER — Other Ambulatory Visit: Payer: Self-pay | Admitting: Family

## 2023-03-04 DIAGNOSIS — N6452 Nipple discharge: Secondary | ICD-10-CM

## 2023-03-11 ENCOUNTER — Ambulatory Visit
Admission: RE | Admit: 2023-03-11 | Discharge: 2023-03-11 | Disposition: A | Discharge status: Home / Self Care | Payer: 59 | Source: Ambulatory Visit | Attending: Family | Admitting: Family

## 2023-03-11 DIAGNOSIS — N6452 Nipple discharge: Secondary | ICD-10-CM | POA: Diagnosis not present

## 2023-03-11 MED ORDER — GADOPICLENOL 0.5 MMOL/ML IV SOLN
7.0000 mL | Freq: Once | INTRAVENOUS | Status: AC | PRN
  Administered 2023-03-11: 7 mL via INTRAVENOUS

## 2023-03-13 ENCOUNTER — Other Ambulatory Visit: Payer: Self-pay | Admitting: Family

## 2023-03-13 ENCOUNTER — Encounter: Payer: Self-pay | Admitting: Family

## 2023-03-13 DIAGNOSIS — R9389 Abnormal findings on diagnostic imaging of other specified body structures: Secondary | ICD-10-CM

## 2023-03-20 ENCOUNTER — Other Ambulatory Visit: Payer: Self-pay | Admitting: Radiology

## 2023-03-20 ENCOUNTER — Ambulatory Visit
Admission: RE | Admit: 2023-03-20 | Discharge: 2023-03-20 | Disposition: A | Discharge status: Home / Self Care | Payer: 59 | Source: Ambulatory Visit | Attending: Family | Admitting: Family

## 2023-03-20 DIAGNOSIS — N6313 Unspecified lump in the right breast, lower outer quadrant: Secondary | ICD-10-CM | POA: Diagnosis not present

## 2023-03-20 DIAGNOSIS — R9389 Abnormal findings on diagnostic imaging of other specified body structures: Secondary | ICD-10-CM

## 2023-03-20 DIAGNOSIS — N6314 Unspecified lump in the right breast, lower inner quadrant: Secondary | ICD-10-CM | POA: Diagnosis not present

## 2023-03-20 DIAGNOSIS — N6315 Unspecified lump in the right breast, overlapping quadrants: Secondary | ICD-10-CM | POA: Diagnosis not present

## 2023-03-20 MED ORDER — GADOPICLENOL 0.5 MMOL/ML IV SOLN: 7.0000 mL | Freq: Once | INTRAVENOUS | Status: DC | PRN

## 2023-03-25 ENCOUNTER — Ambulatory Visit: Payer: Self-pay | Admitting: General Surgery

## 2023-03-25 DIAGNOSIS — D241 Benign neoplasm of right breast: Secondary | ICD-10-CM | POA: Diagnosis not present

## 2023-03-25 MED ORDER — KETOROLAC TROMETHAMINE 15 MG/ML IJ SOLN: 15.0000 mg | Freq: Once | INTRAMUSCULAR | Status: AC

## 2023-03-26 ENCOUNTER — Other Ambulatory Visit: Payer: Self-pay | Admitting: General Surgery

## 2023-03-26 DIAGNOSIS — M7672 Peroneal tendinitis, left leg: Secondary | ICD-10-CM | POA: Diagnosis not present

## 2023-03-26 DIAGNOSIS — D241 Benign neoplasm of right breast: Secondary | ICD-10-CM

## 2023-03-26 DIAGNOSIS — S93492A Sprain of other ligament of left ankle, initial encounter: Secondary | ICD-10-CM | POA: Diagnosis not present

## 2023-03-27 ENCOUNTER — Telehealth: Payer: Self-pay | Admitting: Hematology and Oncology

## 2023-03-27 NOTE — Telephone Encounter (Signed)
scheduled per 3/29 referral, pt has been called and confirmed date and time. Pt is aware of location and to arrive early for check in

## 2023-04-13 ENCOUNTER — Encounter: Payer: Self-pay | Admitting: Hematology and Oncology

## 2023-04-13 ENCOUNTER — Inpatient Hospital Stay: Payer: 59 | Attending: Hematology and Oncology | Admitting: Hematology and Oncology

## 2023-04-13 ENCOUNTER — Inpatient Hospital Stay: Payer: 59

## 2023-04-13 VITALS — BP 135/74 | HR 73 | Temp 97.9°F | Resp 16 | Ht 63.0 in | Wt 163.9 lb

## 2023-04-13 DIAGNOSIS — Z90710 Acquired absence of both cervix and uterus: Secondary | ICD-10-CM | POA: Insufficient documentation

## 2023-04-13 DIAGNOSIS — Z1501 Genetic susceptibility to malignant neoplasm of breast: Secondary | ICD-10-CM | POA: Insufficient documentation

## 2023-04-13 DIAGNOSIS — Z800 Family history of malignant neoplasm of digestive organs: Secondary | ICD-10-CM | POA: Diagnosis not present

## 2023-04-13 DIAGNOSIS — Z79899 Other long term (current) drug therapy: Secondary | ICD-10-CM | POA: Insufficient documentation

## 2023-04-13 DIAGNOSIS — Z1509 Genetic susceptibility to other malignant neoplasm: Secondary | ICD-10-CM | POA: Insufficient documentation

## 2023-04-13 DIAGNOSIS — Z803 Family history of malignant neoplasm of breast: Secondary | ICD-10-CM | POA: Diagnosis not present

## 2023-04-13 DIAGNOSIS — I10 Essential (primary) hypertension: Secondary | ICD-10-CM | POA: Diagnosis not present

## 2023-04-13 DIAGNOSIS — E785 Hyperlipidemia, unspecified: Secondary | ICD-10-CM | POA: Diagnosis not present

## 2023-04-13 NOTE — Progress Notes (Signed)
[facility] CONSULT NOTE  Patient Care Team: Marva Panda, NP as PCP - General  CHIEF COMPLAINTS/PURPOSE OF CONSULTATION:  At high risk of breast cancer.  ASSESSMENT & PLAN:  This is a very pleasant 57 year old postmenopausal female patient with some family history of breast cancer in maternal grandmother, father with colon cancer referred to high risk breast clinic.  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  She also had mammograms and MRIs with some non-mass enhancement, indeterminate and enhancing nodules, had breast biopsies which did not show any evidence of malignancy but patient is concerned if the right area was sampled.  We have today discussed about lifetime risk of breast cancer based on Tyrer-Cuzick model which estimated around 12.6% and 5-year risk of breast cancer based on Gail's model which was about 1.5%.  Based on this above-mentioned data, at this time she is not a candidate for MRI based screening or antiestrogen therapy.  I have however discussed about healthy lifestyle interventions such as regular exercise, dietary changes with more plant-based meals as well as self breast exam on a regular basis.  At this time I recommended that we follow-up with a telephone visit to review final pathology after lumpectomy.  She also requested referral to genetics which was placed. If her final pathology is without any evidence of malignancy, she can continue breast cancer screening with her primary care physician as well as breast exams with her primary care physician.  She is completely agreeable to this plan.  Thank you for consulting [place] in the care of this patient.  Please do not hesitate to contact us with any additional questions or concerns.   HISTORY OF PRESENTING ILLNESS:  Tricia Atkins 57 y.o. female is here because of high risk for breast cancer.  This is a very pleasant 57 year old postmenopausal female patient with family  history of breast cancer possibly in maternal grandmother, father with colon cancer referred from primary care's office given her 81 and me testing suggesting lifetime risk of breast cancer greater than 20%.  She tells me that she is not entirely sure but there was be conversation if her maternal grandmother had breast cancer and died at the age of 67.  Other than that her father had metastatic colorectal cancer at the age of 2.  She is healthy at baseline, on amlodipine for vasodilation since she was thought to have coronary vascular disease.  She had multiple breast biopsies recently given some concerns and all of these turned out to be benign but she is unsure if there was sampling better especially because sampling was not done at the area of concerns.  She is following up with Dr. Carolynne Edouard from breast surgery for further recommendations.  Father had colon cancer and had metastatic colon cancer in her early 20's. Paternal grand mother had stomach cancer Maternal grand mother may have had breast cancer. Age at first birth: 48 , Birth control use for more than 5 yrs- yes No recent use of HRT, tried it more than 20 yrs ago Breast feeding, yes for about 3 months.  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 She is dealing with an ankle injury and is on short term disability. Rest of the pertinent 10 point ROS reviewed and negative MEDICAL HISTORY:  Past Medical History:  Diagnosis Date   Hyperlipidemia    Hypertension     SURGICAL HISTORY: Past Surgical History:  Procedure Laterality Date   BREAST  BIOPSY Right 03/03/2023   [place] RT BREAST BX W LOC DEV 1ST LESION IMG BX SPEC US GUIDE 03/03/2023 [facility] MAMMOGRAPHY   INCONTINENCE SURGERY     TOTAL ABDOMINAL HYSTERECTOMY      SOCIAL HISTORY: Social History   Socioeconomic History   Marital status: Married    Spouse name: Not on file   Number of children: 2   Years of education: Not on file   Highest education level: Not on file   Occupational History   Not on file  Tobacco Use   Smoking status: Never   Smokeless tobacco: Never  Vaping Use   Vaping Use: Never used  Substance and Sexual Activity   Alcohol use: Yes    Alcohol/week: 1.0 standard drink of alcohol    Types: 1 Glasses of wine per week    Comment: occasionally   Drug use: Never   Sexual activity: Not on file  Other Topics Concern   Not on file  Social History Narrative   Not on file   Social Determinants of Health   Financial Resource Strain: Not on file  Food Insecurity: Not on file  Transportation Needs: Not on file  Physical Activity: Not on file  Stress: Not on file  Social Connections: Not on file  Intimate Partner Violence: Not on file    FAMILY HISTORY: Family History  Problem Relation Age of Onset   Hyperlipidemia Mother    Hypertension Mother    Heart attack Father    CAD Father    Hypertension Brother    Breast cancer Maternal Grandmother     ALLERGIES:  is allergic to gluten meal.  MEDICATIONS:  Current Outpatient Medications  Medication Sig Dispense Refill   acetaminophen (TYLENOL) 500 MG tablet Take 500 mg by mouth every 6 (six) hours as needed.     amLODipine (NORVASC) 2.5 MG tablet TAKE 1 TABLET(2.5 MG) BY MOUTH DAILY 90 tablet 3   celecoxib (CELEBREX) 100 MG capsule Take 1 capsule (100 mg total) by mouth daily as needed for 10 days. 10 capsule 0   meloxicam (MOBIC) 7.5 MG tablet Take 1 tablet (7.5 mg total) by mouth as needed. 20 tablet 0   nitroGLYCERIN (NITROSTAT) 0.4 MG SL tablet DISSOLVE 1 TABLET UNDER THE TONGUE EVERY 5 MINUTES FOR UP TO 3 DOSES AS NEEDED FOR CHEST PAIN 25 tablet 3   omeprazole (PRILOSEC) 10 MG capsule Take 1 capsule (10 mg total) by mouth daily. 90 capsule 1   rosuvastatin (CRESTOR) 20 MG tablet Take 20 mg by mouth daily.     No current facility-administered medications for this visit.     PHYSICAL EXAMINATION: ECOG PERFORMANCE STATUS: 0 - Asymptomatic  Vitals:   04/13/23 1005   BP: 135/74  Pulse: 73  Resp: 16  Temp: 97.9 F (36.6 C)  SpO2: 97%   Filed Weights   04/13/23 1005  Weight: 163 lb 14.4 oz (74.3 kg)    GENERAL:alert, no distress and comfortable Breast: Bilateral breasts inspected and palpated.  Right breast with postbiopsy changes from recent biopsy.  Left breast normal to inspection palpation.  No regional adenopathy.  LABORATORY DATA:  I have reviewed the data as listed Lab Results  Component Value Date   HGB 14.3 12/05/2012   HCT 42.0 12/05/2012     Chemistry      Component Value Date/Time   NA 146 (H) 12/05/2012 2302   K 4.0 12/05/2012 2302   CL 110 12/05/2012 2302   BUN 22 12/05/2012 2302  CREATININE 0.80 12/05/2012 2302   No results found for: "CALCIUM", "ALKPHOS", "AST", "ALT", "BILITOT"     RADIOGRAPHIC STUDIES: I have personally reviewed the radiological images as listed and agreed with the findings in the report. MR RT BREAST BX W LOC DEV 1ST LESION IMAGE BX SPEC MR GUIDE  Addendum Date: 03/25/2023   ADDENDUM REPORT: 03/25/2023 20:34 ADDENDUM: Pathology revealed breast, RIGHT, needle core biopsy, 6 o'clock, (barbell clip): FRAGMENTS OF A BENIGN INTRADUCTAL PAPILLOMA, FIBROCYSTIC CHANGES INCLUDING STROMAL FIBROSIS, CYSTIC DILATATION OF DUCTS AND FOCAL USUAL DUCT HYPERPLASIA- NEGATIVE FOR MICROCALCIFICATIONS-NEGATIVE FOR ATYPIA AND CARCINOMA. This was found to be concordant by Dr. Gerome Sam, with excision recommended. Pathology revealed breast, RIGHT, needle core biopsy, 5 o'clock, (cylinder clip): FRAGMENTS OF A BENIGN INTRADUCTAL PAPILLOMA, FIBROCYSTIC CHANGES INCLUDING STROMAL FIBROSIS AND CYSTIC DILATION OF DUCTS- NEGATIVE FOR MICROCALCIFICATIONS-NEGATIVE FOR ATYPIA AND CARCINOMA. This was found to be concordant by Dr. Gerome Sam, with excision recommended. Pathology revealed breast, RIGHT, needle core biopsy, 7 o'clock, (top hat clip): MINUTE FRAGMENT OF INTRADUCTAL PAPILLOMA FIBROCYSTIC CHANGES INCLUDING STROMAL  FIBROSIS, CYSTIC DILATATION OF DUCTS AND FOCAL USUAL DUCT HYPERPLASIA, FRAGMENT OF CYST WALL-NEGATIVE FOR MICROCALCIFICATIONS- NEGATIVE FOR ATYPIA AND CARCINOMA. This was found to be concordant by Dr. Gerome Sam, with excision recommended. Pathology results were discussed with the patient by telephone. The patient reported doing well after the biopsies with tenderness and bleeding that has stopped after applying pressure dressing at the sites. Post biopsy instructions and care were reviewed and questions were answered. The patient was encouraged to call [facility] for any additional concerns. Surgical consultation is arranged with Dr. Chevis Pretty at [facility] on March 25, 2023. Pathology results reported by Collene Mares RN on 03/23/2023. Electronically Signed   By: Gerome Sam III M.D.   On: 03/25/2023 20:34   Addendum Date: 03/20/2023   ADDENDUM REPORT: 03/20/2023 10:28 ADDENDUM: A top hat shaped clip was actually placed at the site of the biopsied 7 o'clock mass. Electronically Signed   By: Gerome Sam III M.D.   On: 03/20/2023 10:28   Result Date: 03/25/2023 CLINICAL DATA:  MRI guided biopsy of 3 right breast masses. EXAM: MRI GUIDED CORE NEEDLE BIOPSY OF THE RIGHT BREAST TECHNIQUE: Multiplanar, multisequence MR imaging of the right breast was performed both before and after administration of intravenous contrast. CONTRAST:  7 mL vueway COMPARISON:  Previous exam(s). FINDINGS: I met with the patient, and we discussed the procedure of MRI guided biopsy, including risks, benefits, and alternatives. Specifically, we discussed the risks of infection, bleeding, tissue injury, clip migration, and inadequate sampling. Informed, written consent was given. The usual time out protocol was performed immediately prior to the procedure. Using sterile technique, 1% Lidocaine, MRI guidance, and a 9 gauge vacuum assisted device, biopsy was performed of the 6 o'clock right  breast mass using a medial approach. At the conclusion of the procedure, a barbell tissue marker clip was deployed into the biopsy cavity. Follow-up 2-view mammogram was performed and dictated separately. Using sterile technique, 1% Lidocaine, MRI guidance, and a 9 gauge vacuum assisted device, biopsy was performed of the 5 o'clock right breast mass using a medial approach. At the conclusion of the procedure, a cylinder tissue marker clip was deployed into the biopsy cavity. Follow-up 2-view mammogram was performed and dictated separately. Using sterile technique, 1% Lidocaine, MRI guidance, and a 9 gauge vacuum assisted device, biopsy was performed of the 7 o'clock right breast mass using a medial approach.  At the conclusion of the procedure, a bow tie tissue marker clip was deployed into the biopsy cavity. Follow-up 2-view mammogram was performed and dictated separately. IMPRESSION: MRI guided biopsy of 3 right breast masses. No apparent complications. Electronically Signed: By: Gerome Sam III M.D. On: 03/20/2023 10:00  MR RT BREAST BX W LOC DEV EA ADD LESION IMAGE BX SPEC MR GUIDE  Addendum Date: 03/25/2023   ADDENDUM REPORT: 03/25/2023 20:34 ADDENDUM: Pathology revealed breast, RIGHT, needle core biopsy, 6 o'clock, (barbell clip): FRAGMENTS OF A BENIGN INTRADUCTAL PAPILLOMA, FIBROCYSTIC CHANGES INCLUDING STROMAL FIBROSIS, CYSTIC DILATATION OF DUCTS AND FOCAL USUAL DUCT HYPERPLASIA- NEGATIVE FOR MICROCALCIFICATIONS-NEGATIVE FOR ATYPIA AND CARCINOMA. This was found to be concordant by Dr. Gerome Sam, with excision recommended. Pathology revealed breast, RIGHT, needle core biopsy, 5 o'clock, (cylinder clip): FRAGMENTS OF A BENIGN INTRADUCTAL PAPILLOMA, FIBROCYSTIC CHANGES INCLUDING STROMAL FIBROSIS AND CYSTIC DILATION OF DUCTS- NEGATIVE FOR MICROCALCIFICATIONS-NEGATIVE FOR ATYPIA AND CARCINOMA. This was found to be concordant by Dr. Gerome Sam, with excision recommended. Pathology revealed breast,  RIGHT, needle core biopsy, 7 o'clock, (top hat clip): MINUTE FRAGMENT OF INTRADUCTAL PAPILLOMA FIBROCYSTIC CHANGES INCLUDING STROMAL FIBROSIS, CYSTIC DILATATION OF DUCTS AND FOCAL USUAL DUCT HYPERPLASIA, FRAGMENT OF CYST WALL-NEGATIVE FOR MICROCALCIFICATIONS- NEGATIVE FOR ATYPIA AND CARCINOMA. This was found to be concordant by Dr. Gerome Sam, with excision recommended. Pathology results were discussed with the patient by telephone. The patient reported doing well after the biopsies with tenderness and bleeding that has stopped after applying pressure dressing at the sites. Post biopsy instructions and care were reviewed and questions were answered. The patient was encouraged to call [facility] for any additional concerns. Surgical consultation is arranged with Dr. Chevis Pretty at [facility] on March 25, 2023. Pathology results reported by Collene Mares RN on 03/23/2023. Electronically Signed   By: Gerome Sam III M.D.   On: 03/25/2023 20:34   Addendum Date: 03/20/2023   ADDENDUM REPORT: 03/20/2023 10:28 ADDENDUM: A top hat shaped clip was actually placed at the site of the biopsied 7 o'clock mass. Electronically Signed   By: Gerome Sam III M.D.   On: 03/20/2023 10:28   Result Date: 03/25/2023 CLINICAL DATA:  MRI guided biopsy of 3 right breast masses. EXAM: MRI GUIDED CORE NEEDLE BIOPSY OF THE RIGHT BREAST TECHNIQUE: Multiplanar, multisequence MR imaging of the right breast was performed both before and after administration of intravenous contrast. CONTRAST:  7 mL vueway COMPARISON:  Previous exam(s). FINDINGS: I met with the patient, and we discussed the procedure of MRI guided biopsy, including risks, benefits, and alternatives. Specifically, we discussed the risks of infection, bleeding, tissue injury, clip migration, and inadequate sampling. Informed, written consent was given. The usual time out protocol was performed immediately prior to the procedure.  Using sterile technique, 1% Lidocaine, MRI guidance, and a 9 gauge vacuum assisted device, biopsy was performed of the 6 o'clock right breast mass using a medial approach. At the conclusion of the procedure, a barbell tissue marker clip was deployed into the biopsy cavity. Follow-up 2-view mammogram was performed and dictated separately. Using sterile technique, 1% Lidocaine, MRI guidance, and a 9 gauge vacuum assisted device, biopsy was performed of the 5 o'clock right breast mass using a medial approach. At the conclusion of the procedure, a cylinder tissue marker clip was deployed into the biopsy cavity. Follow-up 2-view mammogram was performed and dictated separately. Using sterile technique, 1% Lidocaine, MRI guidance, and a 9 gauge vacuum assisted device, biopsy  was performed of the 7 o'clock right breast mass using a medial approach. At the conclusion of the procedure, a bow tie tissue marker clip was deployed into the biopsy cavity. Follow-up 2-view mammogram was performed and dictated separately. IMPRESSION: MRI guided biopsy of 3 right breast masses. No apparent complications. Electronically Signed: By: Gerome Sam III M.D. On: 03/20/2023 10:00  MR RT BREAST BX W LOC DEV EA ADD LESION IMAGE BX SPEC MR GUIDE  Addendum Date: 03/25/2023   ADDENDUM REPORT: 03/25/2023 20:34 ADDENDUM: Pathology revealed breast, RIGHT, needle core biopsy, 6 o'clock, (barbell clip): FRAGMENTS OF A BENIGN INTRADUCTAL PAPILLOMA, FIBROCYSTIC CHANGES INCLUDING STROMAL FIBROSIS, CYSTIC DILATATION OF DUCTS AND FOCAL USUAL DUCT HYPERPLASIA- NEGATIVE FOR MICROCALCIFICATIONS-NEGATIVE FOR ATYPIA AND CARCINOMA. This was found to be concordant by Dr. Gerome Sam, with excision recommended. Pathology revealed breast, RIGHT, needle core biopsy, 5 o'clock, (cylinder clip): FRAGMENTS OF A BENIGN INTRADUCTAL PAPILLOMA, FIBROCYSTIC CHANGES INCLUDING STROMAL FIBROSIS AND CYSTIC DILATION OF DUCTS- NEGATIVE FOR MICROCALCIFICATIONS-NEGATIVE  FOR ATYPIA AND CARCINOMA. This was found to be concordant by Dr. Gerome Sam, with excision recommended. Pathology revealed breast, RIGHT, needle core biopsy, 7 o'clock, (top hat clip): MINUTE FRAGMENT OF INTRADUCTAL PAPILLOMA FIBROCYSTIC CHANGES INCLUDING STROMAL FIBROSIS, CYSTIC DILATATION OF DUCTS AND FOCAL USUAL DUCT HYPERPLASIA, FRAGMENT OF CYST WALL-NEGATIVE FOR MICROCALCIFICATIONS- NEGATIVE FOR ATYPIA AND CARCINOMA. This was found to be concordant by Dr. Gerome Sam, with excision recommended. Pathology results were discussed with the patient by telephone. The patient reported doing well after the biopsies with tenderness and bleeding that has stopped after applying pressure dressing at the sites. Post biopsy instructions and care were reviewed and questions were answered. The patient was encouraged to call [facility] for any additional concerns. Surgical consultation is arranged with Dr. Chevis Pretty at [facility] on March 25, 2023. Pathology results reported by Collene Mares RN on 03/23/2023. Electronically Signed   By: Gerome Sam III M.D.   On: 03/25/2023 20:34   Addendum Date: 03/20/2023   ADDENDUM REPORT: 03/20/2023 10:28 ADDENDUM: A top hat shaped clip was actually placed at the site of the biopsied 7 o'clock mass. Electronically Signed   By: Gerome Sam III M.D.   On: 03/20/2023 10:28   Result Date: 03/25/2023 CLINICAL DATA:  MRI guided biopsy of 3 right breast masses. EXAM: MRI GUIDED CORE NEEDLE BIOPSY OF THE RIGHT BREAST TECHNIQUE: Multiplanar, multisequence MR imaging of the right breast was performed both before and after administration of intravenous contrast. CONTRAST:  7 mL vueway COMPARISON:  Previous exam(s). FINDINGS: I met with the patient, and we discussed the procedure of MRI guided biopsy, including risks, benefits, and alternatives. Specifically, we discussed the risks of infection, bleeding, tissue injury, clip migration, and  inadequate sampling. Informed, written consent was given. The usual time out protocol was performed immediately prior to the procedure. Using sterile technique, 1% Lidocaine, MRI guidance, and a 9 gauge vacuum assisted device, biopsy was performed of the 6 o'clock right breast mass using a medial approach. At the conclusion of the procedure, a barbell tissue marker clip was deployed into the biopsy cavity. Follow-up 2-view mammogram was performed and dictated separately. Using sterile technique, 1% Lidocaine, MRI guidance, and a 9 gauge vacuum assisted device, biopsy was performed of the 5 o'clock right breast mass using a medial approach. At the conclusion of the procedure, a cylinder tissue marker clip was deployed into the biopsy cavity. Follow-up 2-view mammogram was performed and dictated separately. Using sterile  technique, 1% Lidocaine, MRI guidance, and a 9 gauge vacuum assisted device, biopsy was performed of the 7 o'clock right breast mass using a medial approach. At the conclusion of the procedure, a bow tie tissue marker clip was deployed into the biopsy cavity. Follow-up 2-view mammogram was performed and dictated separately. IMPRESSION: MRI guided biopsy of 3 right breast masses. No apparent complications. Electronically Signed: By: Gerome Sam III M.D. On: 03/20/2023 10:00  MM CLIP PLACEMENT RIGHT  Result Date: 03/20/2023 CLINICAL DATA:  Evaluate biopsy markers EXAM: 3D DIAGNOSTIC RIGHT MAMMOGRAM POST MRI BIOPSY COMPARISON:  Previous exam(s). FINDINGS: 3D Mammographic images were obtained following MRI guided biopsy of a right 6 o'clock mass, 5 o'clock mass, and 7 o'clock mass. The barbell shaped clip is at the site of the biopsied 6 o'clock mass. The cylinder shaped clip is at the site of the biopsied 5 o'clock mass. A top hat shaped clip is at the site of the biopsied 7 o'clock mass. IMPRESSION: The barbell shaped clip is at the site of the biopsied 6 o'clock mass. The cylinder shaped clip  is at the site of the biopsied 5 o'clock mass. A top hat shaped clip is at the site of the biopsied 7 o'clock mass. Final Assessment: Post Procedure Mammograms for Marker Placement Electronically Signed   By: Gerome Sam III M.D.   On: 03/20/2023 10:28   All questions were answered. The patient knows to call the clinic with any problems, questions or concerns. I spent 45 minutes in the care of this patient including H and P, review of records, counseling and coordination of care.     Rachel Moulds, MD 04/13/2023 10:27 AM

## 2023-04-14 ENCOUNTER — Telehealth: Payer: Self-pay | Admitting: Hematology and Oncology

## 2023-04-14 NOTE — Telephone Encounter (Signed)
Reached out to patient to schedule per 4/16 IB, patient aware of date and time of appointment.

## 2023-04-15 ENCOUNTER — Other Ambulatory Visit: Payer: Self-pay | Admitting: Genetic Counselor

## 2023-04-15 ENCOUNTER — Encounter: Payer: Self-pay | Admitting: Genetic Counselor

## 2023-04-15 ENCOUNTER — Inpatient Hospital Stay: Payer: 59

## 2023-04-15 ENCOUNTER — Other Ambulatory Visit: Payer: Self-pay

## 2023-04-15 ENCOUNTER — Inpatient Hospital Stay (HOSPITAL_BASED_OUTPATIENT_CLINIC_OR_DEPARTMENT_OTHER): Payer: 59 | Admitting: Genetic Counselor

## 2023-04-15 DIAGNOSIS — Z800 Family history of malignant neoplasm of digestive organs: Secondary | ICD-10-CM

## 2023-04-15 LAB — GENETIC SCREENING ORDER

## 2023-04-15 NOTE — Progress Notes (Addendum)
REFERRING PROVIDER: Rachel Moulds, MD [address] [place],  [place] [address]  PRIMARY PROVIDER:  Marva Panda, NP  PRIMARY REASON FOR VISIT:  1. Family history of colon cancer   2. Family history of stomach cancer      HISTORY OF PRESENT ILLNESS:   Tricia Atkins, a 57 y.o. female, was seen for a [facility] cancer genetics consultation at the request of Dr. Al Pimple due to a family history of cancer.  Tricia Atkins presents to clinic today to discuss the possibility of a hereditary predisposition to cancer, genetic testing, and to further clarify her future cancer risks, as well as potential cancer risks for family members.   Tricia Atkins is a 57 y.o. female with no personal history of cancer.  She will undergo lumpectomies on March 01, 2023.  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    CANCER HISTORY:  Oncology History   No history exists.     RISK FACTORS:  Menarche was at age 72.  First live birth at age 83.  OCP use for approximately  5+  years.  Ovaries intact: no.  Hysterectomy: yes.  Menopausal status: postmenopausal.  HRT use:  2-3  years. Colonoscopy: yes;  1-2 polyps . Mammogram within the last year: yes. Number of breast biopsies: 1. Up to date with pelvic exams: n/a. Any excessive radiation exposure in the past: no  Past Medical History:  Diagnosis Date   Family history of colon cancer    Family history of stomach cancer    Hyperlipidemia     Past Surgical History:  Procedure Laterality Date   BREAST BIOPSY Right 03/03/2023   [place] RT BREAST BX W LOC DEV 1ST LESION IMG BX SPEC US GUIDE 03/03/2023 [facility]   INCONTINENCE SURGERY     TOTAL ABDOMINAL HYSTERECTOMY      Social History   Socioeconomic History   Marital status: Married    Spouse name: Not on file   Number of children: 2   Years of education: Not on file   Highest education level: Not on file  Occupational History   Not on file  Tobacco Use   Smoking  status: Never   Smokeless tobacco: Never  Vaping Use   Vaping Use: Never used  Substance and Sexual Activity   Alcohol use: Yes    Alcohol/week: 1.0 standard drink of alcohol    Types: 1 Glasses of wine per week    Comment: occasionally   Drug use: Never   Sexual activity: Not on file  Other Topics Concern   Not on file  Social History Narrative   Not on file   Social Determinants of Health   Financial Resource Strain: Not on file  Food Insecurity: Not on file  Transportation Needs: Not on file  Physical Activity: Not on file  Stress: Not on file  Social Connections: Not on file     FAMILY HISTORY:  We obtained a detailed, 4-generation family history.  Significant diagnoses are listed below: Family History  Problem Relation Age of Onset   Hyperlipidemia Mother    Hypertension Mother    Heart attack Father    CAD Father    Colon cancer Father 90       metastatic in early 58s   Hypertension Brother    Breast cancer Maternal Grandmother        unsure if breast cancer vs mastitis, vs something else   Heart disease Maternal Grandmother 18  Stomach cancer Paternal Grandmother      The patient has two children who are cancer free.  She has a brother who is cancer free.  Her father is deceased and her mother is living.  The patient's father had colon cancer at 53 and died at 10.  His father died in WWII and his mother died of stomach cancer.  The patient's mother is living.  She had three brothers and a sister who were cancer free.  The maternal grandmother may have had breast cancer, or possibly some other breast disease.  She died of heart disease at 86.  Tricia Atkins is unaware of previous family history of genetic testing for hereditary cancer risks. Patient's maternal ancestors are of [place], [place], [place] and [place] descent, and paternal ancestors are of [place] and [place] descent. There is no reported Ashkenazi Jewish ancestry. There is no known  consanguinity.  GENETIC COUNSELING ASSESSMENT: Tricia Atkins is a 57 y.o. female with a family history of cancer which is somewhat suggestive of a hereditary cancer syndrome and predisposition to cancer given the young age of onset of colon cancer. We, therefore, discussed and recommended the following at today's visit.   DISCUSSION: We discussed that, in general, most cancer is not inherited in families, but instead is sporadic or familial. Sporadic cancers occur by chance and typically happen at older ages (>50 years) as this type of cancer is caused by genetic changes acquired during an individual's lifetime. Some families have more cancers than would be expected by chance; however, the ages or types of cancer are not consistent with a known genetic mutation or known genetic mutations have been ruled out. This type of familial cancer is thought to be due to a combination of multiple genetic, environmental, hormonal, and lifestyle factors. While this combination of factors likely increases the risk of cancer, the exact source of this risk is not currently identifiable or testable.  We discussed that 5 - 10% of cancer is hereditary.  Most cases of colon cancer are associated with Lynch syndrome.  There are other genes that can be associated with hereditary colon cancer syndromes.  These include BMPR1A, APC, and others.  We discussed that testing is beneficial for several reasons including knowing how to follow individuals after completing their treatment, identifying whether potential treatment options such as PARP inhibitors would be beneficial, and understand if other family members could be at risk for cancer and allow them to undergo genetic testing.   We reviewed the characteristics, features and inheritance patterns of hereditary cancer syndromes. We also discussed genetic testing, including the appropriate family members to test, the process of testing, insurance coverage and turn-around-time for  results. We discussed the implications of a negative, positive, carrier and/or variant of uncertain significant result. Tricia Atkins  was offered a common hereditary cancer panel (47 genes) and an expanded pan-cancer panel (77 genes). Tricia Atkins was informed of the benefits and limitations of each panel, including that expanded pan-cancer panels contain genes that do not have clear management guidelines at this point in time.  We also discussed that as the number of genes included on a panel increases, the chances of variants of uncertain significance increases. Tricia Atkins decided to pursue genetic testing for the CancerNext-Expanded+RNAinsight gene panel.   The CancerNext-Expanded gene panel offered by [facility] and includes sequencing and rearrangement analysis for the following 71 genes: AIP, ALK, APC, ATM, BAP1, BARD1, BMPR1A, BRCA1, BRCA2, BRIP1, CDC73, CDH1, CDK4, CDKN1B, CDKN2A, CHEK2, DICER1, FH, FLCN,  KIF1B, LZTR1, MAX, MEN1, MET, MLH1, MSH2, MSH6, MUTYH, NF1, NF2, NTHL1, PALB2, PHOX2B, PMS2, POT1, PRKAR1A, PTCH1, PTEN, RAD51C, RAD51D, RB1, RET, SDHA, SDHAF2, SDHB, SDHC, SDHD, SMAD4, SMARCA4, SMARCB1, SMARCE1, STK11, SUFU, TMEM127, TP53, TSC1, TSC2 and VHL (sequencing and deletion/duplication); AXIN2, CTNNA1, EGFR, EGLN1, HOXB13, KIT, MITF, MSH3, PDGFRA, POLD1 and POLE (sequencing only); EPCAM and GREM1 (deletion/duplication only). RNA data is routinely analyzed for use in variant interpretation for all genes.   Based on Tricia Atkins's family history of cancer, she meets medical criteria for genetic testing. Despite that she meets criteria, she may still have an out of pocket cost. We discussed that if her out of pocket cost for testing is over $100, the laboratory will call and confirm whether she wants to proceed with testing.  If the out of pocket cost of testing is less than $100 she will be billed by the genetic testing laboratory.   We discussed that some people do not want to undergo  genetic testing due to fear of genetic discrimination.  The Genetic Information Nondiscrimination Act (GINA) was signed into federal law in 2008. GINA prohibits health insurers and most employers from discriminating against individuals based on genetic information (including the results of genetic tests and family history information). According to GINA, health insurance companies cannot consider genetic information to be a preexisting condition, nor can they use it to make decisions regarding coverage or rates. GINA also makes it illegal for most employers to use genetic information in making decisions about hiring, firing, promotion, or terms of employment. It is important to note that GINA does not offer protections for life insurance, disability insurance, or long-term care insurance. GINA does not apply to those in the [employer], those who work for companies with less than 15 employees, and new life insurance or long-term disability insurance policies.  Health status due to a cancer diagnosis is not protected under GINA. More information about GINA can be found by visiting [email or web address].   PLAN: After considering the risks, benefits, and limitations, Tricia Atkins provided informed consent to pursue genetic testing and the blood sample was sent to [facility] for analysis of the CancerNext-Expanded+RNAinsight. Results should be available within approximately 2-3 weeks' time, at which point they will be disclosed by telephone to Tricia Atkins, as will any additional recommendations warranted by these results. Tricia Atkins will receive a summary of her genetic counseling visit and a copy of her results once available. This information will also be available in Epic.   Lastly, we encouraged Tricia Atkins to remain in contact with cancer genetics annually so that we can continuously update the family history and inform her of any changes in cancer genetics and testing that may be of benefit for  this family.   Ms. Ander's questions were answered to her satisfaction today. Our contact information was provided should additional questions or concerns arise. Thank you for the referral and allowing [place] to share in the care of your patient.   Miliana Gangwer P. Lowell Guitar, MS, [facility] Licensed, Patent attorney [email or web address] phone: [phone]  The patient was seen for a total of 35 minutes in face-to-face genetic counseling.  The patient was seen alone.  Drs. Meliton Rattan, and/or [place] were available for questions, if needed..    _______________________________________________________________________ For Office Staff:  Number of people involved in session: 1 Was an Intern/ student involved with case: no

## 2023-04-21 ENCOUNTER — Encounter: Payer: Self-pay | Admitting: Genetic Counselor

## 2023-04-21 ENCOUNTER — Telehealth: Payer: Self-pay | Admitting: Genetic Counselor

## 2023-04-21 DIAGNOSIS — Z1379 Encounter for other screening for genetic and chromosomal anomalies: Secondary | ICD-10-CM | POA: Insufficient documentation

## 2023-04-21 NOTE — Telephone Encounter (Signed)
Revealed negative BRCAPlus testing.  Discussed that the remainder of the testing is pending.  These genes can help inform surgical decisions.

## 2023-04-23 ENCOUNTER — Telehealth: Payer: Self-pay | Admitting: Genetic Counselor

## 2023-04-23 NOTE — Telephone Encounter (Signed)
Revealed negative genetic testing.  Discussed that we do not know why she has breast cancer or why there is cancer in the family. It could be due to a different gene that we are not testing, or maybe our current technology may not be able to pick something up.  It will be important for her to keep in contact with genetics to keep up with whether additional testing may be needed. 

## 2023-04-24 ENCOUNTER — Encounter (HOSPITAL_BASED_OUTPATIENT_CLINIC_OR_DEPARTMENT_OTHER): Payer: Self-pay | Admitting: General Surgery

## 2023-04-24 ENCOUNTER — Other Ambulatory Visit: Payer: Self-pay

## 2023-04-24 NOTE — Progress Notes (Signed)
Reviewed with Dr Krista Blue. OK to proceed as planned

## 2023-04-27 ENCOUNTER — Encounter (HOSPITAL_BASED_OUTPATIENT_CLINIC_OR_DEPARTMENT_OTHER)
Admission: RE | Admit: 2023-04-27 | Discharge: 2023-04-27 | Disposition: A | Discharge status: Home / Self Care | Payer: 59 | Source: Ambulatory Visit | Attending: General Surgery | Admitting: General Surgery

## 2023-04-27 DIAGNOSIS — D241 Benign neoplasm of right breast: Secondary | ICD-10-CM | POA: Diagnosis not present

## 2023-04-27 MED ORDER — CHLORHEXIDINE GLUCONATE CLOTH 2 % EX PADS: 6.0000 | MEDICATED_PAD | Freq: Once | CUTANEOUS | Status: DC

## 2023-04-27 NOTE — Progress Notes (Signed)

## 2023-04-30 ENCOUNTER — Ambulatory Visit
Admission: RE | Admit: 2023-04-30 | Discharge: 2023-04-30 | Disposition: A | Discharge status: Home / Self Care | Payer: 59 | Source: Ambulatory Visit | Attending: General Surgery | Admitting: General Surgery

## 2023-04-30 ENCOUNTER — Other Ambulatory Visit: Payer: Self-pay | Admitting: General Surgery

## 2023-04-30 ENCOUNTER — Other Ambulatory Visit (HOSPITAL_COMMUNITY): Payer: Self-pay

## 2023-04-30 DIAGNOSIS — D241 Benign neoplasm of right breast: Secondary | ICD-10-CM

## 2023-04-30 DIAGNOSIS — N63 Unspecified lump in unspecified breast: Secondary | ICD-10-CM | POA: Diagnosis not present

## 2023-04-30 HISTORY — PX: BREAST BIOPSY: SHX20

## 2023-04-30 MED ORDER — ROSUVASTATIN CALCIUM 20 MG PO TABS
20.0000 mg | ORAL_TABLET | Freq: Every day | ORAL | 1 refills | Status: AC
  Filled 2023-04-30: qty 30, 30d supply, fill #0
  Filled 2023-04-30: qty 60, 60d supply, fill #0

## 2023-04-30 MED FILL — Amlodipine Besylate Tab 2.5 MG (Base Equivalent): ORAL | 90 days supply | Qty: 90 | Fill #0 | Status: AC

## 2023-04-30 MED FILL — Amlodipine Besylate Tab 2.5 MG (Base Equivalent): ORAL | 30 days supply | Qty: 30 | Fill #0 | Status: CN

## 2023-05-01 ENCOUNTER — Ambulatory Visit
Admission: RE | Admit: 2023-05-01 | Discharge: 2023-05-01 | Disposition: A | Discharge status: Home / Self Care | Payer: 59 | Source: Ambulatory Visit | Attending: General Surgery | Admitting: General Surgery

## 2023-05-01 ENCOUNTER — Ambulatory Visit (HOSPITAL_BASED_OUTPATIENT_CLINIC_OR_DEPARTMENT_OTHER)
Admission: RE | Admit: 2023-05-01 | Discharge: 2023-05-01 | Disposition: A | Discharge status: Home / Self Care | Payer: 59 | Attending: General Surgery | Admitting: General Surgery

## 2023-05-01 ENCOUNTER — Encounter (HOSPITAL_BASED_OUTPATIENT_CLINIC_OR_DEPARTMENT_OTHER)
Admission: RE | Disposition: A | Discharge status: Home / Self Care | Payer: Self-pay | Source: Home / Self Care | Attending: General Surgery

## 2023-05-01 ENCOUNTER — Other Ambulatory Visit (HOSPITAL_COMMUNITY): Payer: Self-pay

## 2023-05-01 ENCOUNTER — Other Ambulatory Visit: Payer: Self-pay

## 2023-05-01 ENCOUNTER — Ambulatory Visit (HOSPITAL_BASED_OUTPATIENT_CLINIC_OR_DEPARTMENT_OTHER): Payer: 59 | Admitting: Anesthesiology

## 2023-05-01 ENCOUNTER — Encounter (HOSPITAL_BASED_OUTPATIENT_CLINIC_OR_DEPARTMENT_OTHER): Payer: Self-pay | Admitting: General Surgery

## 2023-05-01 DIAGNOSIS — D241 Benign neoplasm of right breast: Secondary | ICD-10-CM | POA: Insufficient documentation

## 2023-05-01 DIAGNOSIS — Z01818 Encounter for other preprocedural examination: Secondary | ICD-10-CM

## 2023-05-01 HISTORY — DX: Other specified postprocedural states: R11.2

## 2023-05-01 HISTORY — DX: Other specified postprocedural states: Z98.890

## 2023-05-01 HISTORY — DX: Pure hypercholesterolemia, unspecified: E78.00

## 2023-05-01 HISTORY — DX: Gastro-esophageal reflux disease without esophagitis: K21.9

## 2023-05-01 HISTORY — PX: BREAST LUMPECTOMY WITH RADIOACTIVE SEED LOCALIZATION: SHX6424

## 2023-05-01 HISTORY — DX: Precordial pain: R07.2

## 2023-05-01 SURGERY — BREAST LUMPECTOMY WITH RADIOACTIVE SEED LOCALIZATION
Anesthesia: General | Site: Breast | Laterality: Right

## 2023-05-01 MED ORDER — PROMETHAZINE HCL 25 MG/ML IJ SOLN: 6.2500 mg | INTRAMUSCULAR | Status: DC | PRN

## 2023-05-01 MED ORDER — LIDOCAINE 2% (20 MG/ML) 5 ML SYRINGE
INTRAMUSCULAR | Status: DC | PRN
  Administered 2023-05-01: 40 mg via INTRAVENOUS

## 2023-05-01 MED ORDER — FENTANYL CITRATE (PF) 100 MCG/2ML IJ SOLN
INTRAMUSCULAR | Status: DC | PRN
  Administered 2023-05-01: 50 ug via INTRAVENOUS

## 2023-05-01 MED ORDER — GABAPENTIN 300 MG PO CAPS
300.0000 mg | ORAL_CAPSULE | ORAL | Status: AC
  Administered 2023-05-01: 300 mg via ORAL

## 2023-05-01 MED ORDER — SCOPOLAMINE 1 MG/3DAYS TD PT72
1.0000 | MEDICATED_PATCH | TRANSDERMAL | Status: DC
  Administered 2023-05-01: 1.5 mg via TRANSDERMAL

## 2023-05-01 MED ORDER — ACETAMINOPHEN 500 MG PO TABS
1000.0000 mg | ORAL_TABLET | ORAL | Status: AC
  Administered 2023-05-01: 1000 mg via ORAL

## 2023-05-01 MED ORDER — FENTANYL CITRATE (PF) 100 MCG/2ML IJ SOLN
INTRAMUSCULAR | Status: AC
  Filled 2023-05-01: qty 2

## 2023-05-01 MED ORDER — CEFAZOLIN SODIUM-DEXTROSE 2-4 GM/100ML-% IV SOLN
INTRAVENOUS | Status: AC
  Filled 2023-05-01: qty 100

## 2023-05-01 MED ORDER — ACETAMINOPHEN 500 MG PO TABS
ORAL_TABLET | ORAL | Status: AC
  Filled 2023-05-01: qty 2

## 2023-05-01 MED ORDER — KETOROLAC TROMETHAMINE 30 MG/ML IJ SOLN
INTRAMUSCULAR | Status: AC
  Filled 2023-05-01: qty 1

## 2023-05-01 MED ORDER — MIDAZOLAM HCL 2 MG/2ML IJ SOLN
INTRAMUSCULAR | Status: AC
  Filled 2023-05-01: qty 2

## 2023-05-01 MED ORDER — MIDAZOLAM HCL 5 MG/5ML IJ SOLN
INTRAMUSCULAR | Status: DC | PRN
  Administered 2023-05-01: 2 mg via INTRAVENOUS

## 2023-05-01 MED ORDER — OXYCODONE HCL 5 MG/5ML PO SOLN: 5.0000 mg | Freq: Once | ORAL | Status: DC | PRN

## 2023-05-01 MED ORDER — LACTATED RINGERS IV SOLN: INTRAVENOUS | Status: DC

## 2023-05-01 MED ORDER — PHENYLEPHRINE HCL (PRESSORS) 10 MG/ML IV SOLN
INTRAVENOUS | Status: DC | PRN
  Administered 2023-05-01 (×2): 80 ug via INTRAVENOUS

## 2023-05-01 MED ORDER — CEFAZOLIN SODIUM-DEXTROSE 2-4 GM/100ML-% IV SOLN
2.0000 g | INTRAVENOUS | Status: AC
  Administered 2023-05-01: 2 g via INTRAVENOUS

## 2023-05-01 MED ORDER — GABAPENTIN 300 MG PO CAPS
ORAL_CAPSULE | ORAL | Status: AC
  Filled 2023-05-01: qty 1

## 2023-05-01 MED ORDER — BUPIVACAINE-EPINEPHRINE (PF) 0.25% -1:200000 IJ SOLN
INTRAMUSCULAR | Status: DC | PRN
  Administered 2023-05-01: 20 mL

## 2023-05-01 MED ORDER — FENTANYL CITRATE (PF) 100 MCG/2ML IJ SOLN
25.0000 ug | INTRAMUSCULAR | Status: DC | PRN
  Administered 2023-05-01: 50 ug via INTRAVENOUS
  Administered 2023-05-01: 25 ug via INTRAVENOUS

## 2023-05-01 MED ORDER — OXYCODONE HCL 5 MG PO TABS
5.0000 mg | ORAL_TABLET | Freq: Four times a day (QID) | ORAL | 0 refills | Status: DC | PRN
Start: 2023-05-01 — End: 2023-05-06
  Filled 2023-05-01: qty 10, 3d supply, fill #0

## 2023-05-01 MED ORDER — ACETAMINOPHEN 10 MG/ML IV SOLN: 1000.0000 mg | Freq: Once | INTRAVENOUS | Status: DC | PRN

## 2023-05-01 MED ORDER — ACETAMINOPHEN 160 MG/5ML PO SOLN: 325.0000 mg | ORAL | Status: DC | PRN

## 2023-05-01 MED ORDER — DEXAMETHASONE SODIUM PHOSPHATE 4 MG/ML IJ SOLN
INTRAMUSCULAR | Status: DC | PRN
  Administered 2023-05-01: 5 mg via INTRAVENOUS

## 2023-05-01 MED ORDER — DEXAMETHASONE SODIUM PHOSPHATE 10 MG/ML IJ SOLN
INTRAMUSCULAR | Status: AC
  Filled 2023-05-01: qty 1

## 2023-05-01 MED ORDER — PROPOFOL 10 MG/ML IV BOLUS
INTRAVENOUS | Status: AC
  Filled 2023-05-01: qty 20

## 2023-05-01 MED ORDER — ACETAMINOPHEN 325 MG PO TABS: 325.0000 mg | ORAL_TABLET | ORAL | Status: DC | PRN

## 2023-05-01 MED ORDER — AMISULPRIDE (ANTIEMETIC) 5 MG/2ML IV SOLN: 10.0000 mg | Freq: Once | INTRAVENOUS | Status: DC | PRN

## 2023-05-01 MED ORDER — LIDOCAINE 2% (20 MG/ML) 5 ML SYRINGE
INTRAMUSCULAR | Status: AC
  Filled 2023-05-01: qty 5

## 2023-05-01 MED ORDER — KETOROLAC TROMETHAMINE 30 MG/ML IJ SOLN
INTRAMUSCULAR | Status: DC | PRN
  Administered 2023-05-01: 30 mg via INTRAVENOUS

## 2023-05-01 MED ORDER — ONDANSETRON HCL 4 MG/2ML IJ SOLN
INTRAMUSCULAR | Status: AC
  Filled 2023-05-01: qty 2

## 2023-05-01 MED ORDER — OXYCODONE HCL 5 MG PO TABS: 5.0000 mg | ORAL_TABLET | Freq: Once | ORAL | Status: DC | PRN

## 2023-05-01 MED ORDER — SCOPOLAMINE 1 MG/3DAYS TD PT72
MEDICATED_PATCH | TRANSDERMAL | Status: AC
  Filled 2023-05-01: qty 1

## 2023-05-01 MED ORDER — PROPOFOL 10 MG/ML IV BOLUS
INTRAVENOUS | Status: DC | PRN
  Administered 2023-05-01: 160 mg via INTRAVENOUS

## 2023-05-01 SURGICAL SUPPLY — 39 items
ADH SKN CLS APL DERMABOND .7 (GAUZE/BANDAGES/DRESSINGS) ×1
APL PRP STRL LF DISP 70% ISPRP (MISCELLANEOUS) ×1
APPLIER CLIP 9.375 MED OPEN (MISCELLANEOUS)
APR CLP MED 9.3 20 MLT OPN (MISCELLANEOUS)
BLADE SURG 15 STRL LF DISP TIS (BLADE) ×1 IMPLANT
BLADE SURG 15 STRL SS (BLADE) ×1
CANISTER SUC SOCK COL 7IN (MISCELLANEOUS) ×1 IMPLANT
CANISTER SUCT 1200ML W/VALVE (MISCELLANEOUS) ×1 IMPLANT
CHLORAPREP W/TINT 26 (MISCELLANEOUS) ×1 IMPLANT
CLIP APPLIE 9.375 MED OPEN (MISCELLANEOUS) IMPLANT
COVER BACK TABLE 60X90IN (DRAPES) ×1 IMPLANT
COVER MAYO STAND STRL (DRAPES) ×1 IMPLANT
COVER PROBE CYLINDRICAL 5X96 (MISCELLANEOUS) ×1 IMPLANT
DERMABOND ADVANCED .7 DNX12 (GAUZE/BANDAGES/DRESSINGS) ×1 IMPLANT
DRAPE LAPAROSCOPIC ABDOMINAL (DRAPES) ×1 IMPLANT
DRAPE UTILITY XL STRL (DRAPES) ×1 IMPLANT
ELECT COATED BLADE 2.86 ST (ELECTRODE) ×1 IMPLANT
ELECT REM PT RETURN 9FT ADLT (ELECTROSURGICAL) ×1
ELECTRODE REM PT RTRN 9FT ADLT (ELECTROSURGICAL) ×1 IMPLANT
GLOVE BIO SURGEON STRL SZ7.5 (GLOVE) ×2 IMPLANT
GOWN STRL REUS W/ TWL LRG LVL3 (GOWN DISPOSABLE) ×2 IMPLANT
GOWN STRL REUS W/TWL LRG LVL3 (GOWN DISPOSABLE) ×2
KIT MARKER MARGIN INK (KITS) ×1 IMPLANT
NDL HYPO 25X1 1.5 SAFETY (NEEDLE) IMPLANT
NEEDLE HYPO 25X1 1.5 SAFETY (NEEDLE) ×1 IMPLANT
NS IRRIG 1000ML POUR BTL (IV SOLUTION) IMPLANT
PACK BASIN DAY SURGERY FS (CUSTOM PROCEDURE TRAY) ×1 IMPLANT
PENCIL SMOKE EVACUATOR (MISCELLANEOUS) ×1 IMPLANT
SLEEVE SCD COMPRESS KNEE MED (STOCKING) ×1 IMPLANT
SPIKE FLUID TRANSFER (MISCELLANEOUS) IMPLANT
SPONGE T-LAP 18X18 ~~LOC~~+RFID (SPONGE) ×1 IMPLANT
SUT MON AB 4-0 PC3 18 (SUTURE) ×1 IMPLANT
SUT SILK 2 0 SH (SUTURE) IMPLANT
SUT VICRYL 3-0 CR8 SH (SUTURE) ×1 IMPLANT
SYR CONTROL 10ML LL (SYRINGE) IMPLANT
TOWEL GREEN STERILE FF (TOWEL DISPOSABLE) ×1 IMPLANT
TRAY FAXITRON CT DISP (TRAY / TRAY PROCEDURE) ×1 IMPLANT
TUBE CONNECTING 20X1/4 (TUBING) ×1 IMPLANT
YANKAUER SUCT BULB TIP NO VENT (SUCTIONS) IMPLANT

## 2023-05-01 NOTE — H&P (Signed)
REFERRING PHYSICIAN: Randa Spike, NP PROVIDER: Lindell Noe, MD MRN: [id number] DOB: 1966/07/17 Subjective  Chief Complaint: New Consultation (Mass of right breast)  History of Present Illness: Tricia Atkins is a 57 y.o. female who is seen today as an office consultation for evaluation of New Consultation (Mass of right breast)  We are asked to see the patient in consultation by Dr. Maren Reamer to evaluate her for papillomas of the right breast. The patient is a 57 year old white female who had an episode of spontaneous clear yellow nipple discharge from the right breast back in January. This happened only 1 time. She was evaluated with mammogram and ultrasound and MRI and found to have 4 areas of suspicion. All of these were biopsied. The superior 1 came back benign but there is some concern for possible DCIS radiographically at this site. The lower 3 were biopsied and came back as fragments of intraductal papilloma. Since her biopsy she has also undergone 23 and me testing that said her lifetime risk of breast cancer was 21% but did not delineate which genes if any she had.  Review of Systems: A complete review of systems was obtained from the patient. I have reviewed this information and discussed as appropriate with the patient. See HPI as well for other ROS.  ROS  Medical History: Past Medical History: Diagnosis Date Hyperlipidemia  Patient Active Problem List Diagnosis Intraductal papilloma of right breast  History reviewed. No pertinent surgical history.  Allergies Allergen Reactions Gluten Other (See Comments) Headaches and constipation  Current Outpatient Medications on File Prior to Visit Medication Sig Dispense Refill amLODIPine (NORVASC) 2.5 MG tablet 1 tablet every 24 hours by oral route. nitroGLYcerin (NITROSTAT) 0.4 MG SL tablet DISSOLVE 1 TABLET UNDER THE TONGUE EVERY 5 MINUTES FOR UP TO 3 DOSES AS NEEDED FOR CHEST PAIN omeprazole (PRILOSEC) 10 MG DR capsule  Take by mouth rosuvastatin (CRESTOR) 20 MG tablet 1 tablet every day by oral route.  No current facility-administered medications on file prior to visit.  History reviewed. No pertinent family history.  Social History  Tobacco Use Smoking Status Never Smokeless Tobacco Never   Social History  Socioeconomic History Marital status: Married Tobacco Use Smoking status: Never Smokeless tobacco: Never Substance and Sexual Activity Alcohol use: Not Currently Drug use: Never  Objective:  Vitals: BP: 118/70 Pulse: 72 Temp: 36.7 C (98 F) SpO2: 98% Weight: 71.2 kg (157 lb) Height: 161.3 cm (5' 3.5") PainSc: 2  Body mass index is 27.38 kg/m.  Physical Exam Vitals reviewed. Constitutional: General: She is not in acute distress. Appearance: Normal appearance. HENT: Head: Normocephalic and atraumatic. Right Ear: External ear normal. Left Ear: External ear normal. Nose: Nose normal. Mouth/Throat: Mouth: Mucous membranes are moist. Pharynx: Oropharynx is clear. Eyes: General: No scleral icterus. Extraocular Movements: Extraocular movements intact. Conjunctiva/sclera: Conjunctivae normal. Pupils: Pupils are equal, round, and reactive to light. Cardiovascular: Rate and Rhythm: Normal rate and regular rhythm. Pulses: Normal pulses. Heart sounds: Normal heart sounds. Pulmonary: Effort: Pulmonary effort is normal. No respiratory distress. Breath sounds: Normal breath sounds. Abdominal: General: Bowel sounds are normal. Palpations: Abdomen is soft. Tenderness: There is no abdominal tenderness. Musculoskeletal: General: No swelling, tenderness or deformity. Normal range of motion. Cervical back: Normal range of motion and neck supple. Skin: General: Skin is warm and dry. Coloration: Skin is not jaundiced. Neurological: General: No focal deficit present. Mental Status: She is alert and oriented to person, place, and time. Psychiatric: Mood and Affect: Mood  normal. Behavior: Behavior normal.  Breast: There is no palpable mass in either breast. There is no palpable axillary, supraclavicular, or cervical lymphadenopathy.  Labs, Imaging and Diagnostic Testing:  Assessment and Plan:  Diagnoses and all orders for this visit:  Intraductal papilloma of right breast   The patient appears to have 4 separate areas of suspicion that all came back benign on biopsy of right breast. Because of their appearance and because there is a 5 to 10% chance of missing something more significant the recommendation is to have these areas removed. She would also like to have them removed as well. I have discussed with her in detail the risks and benefits of the operation as well as some of the technical aspects including the use of radioactive seeds and she understands and wishes to proceed. We will also refer her to the high risk clinic at the cancer center to talk about risk reduction and see if she needs more formal genetic testing to identify her risk profile. We will move forward with surgical scheduling

## 2023-05-01 NOTE — Anesthesia Postprocedure Evaluation (Signed)
Anesthesia Post Note  Patient: SYNIYA CUI  Procedure(s) Performed: RIGHT BREAST LUMPECTOMY WITH RADIOACTIVE SEED LOCALIZATION X4 (Right: Breast)     Patient location during evaluation: PACU Anesthesia Type: General Level of consciousness: awake and alert Pain management: pain level controlled Vital Signs Assessment: post-procedure vital signs reviewed and stable Respiratory status: spontaneous breathing, nonlabored ventilation, respiratory function stable and patient connected to nasal cannula oxygen Cardiovascular status: blood pressure returned to baseline and stable Postop Assessment: no apparent nausea or vomiting Anesthetic complications: no  No notable events documented.  Last Vitals:  Vitals:   05/01/23 1015 05/01/23 1028  BP: 120/71 (!) 129/93  Pulse: 62 62  Resp: 17 14  Temp:  (!) 36.3 C  SpO2: 97% 100%    Last Pain:  Vitals:   05/01/23 1028  TempSrc:   PainSc: 2                  Shelton Silvas

## 2023-05-01 NOTE — Transfer of Care (Signed)
Immediate Anesthesia Transfer of Care Note  Patient: Tricia Atkins  Procedure(s) Performed: RIGHT BREAST LUMPECTOMY WITH RADIOACTIVE SEED LOCALIZATION X4 (Right: Breast)  Patient Location: PACU  Anesthesia Type:General  Level of Consciousness: sedated  Airway & Oxygen Therapy: Patient Spontanous Breathing and Patient connected to face mask oxygen  Post-op Assessment: Report given to RN and Post -op Vital signs reviewed and stable  Post vital signs: Reviewed and stable  Last Vitals:  Vitals Value Taken Time  BP    Temp    Pulse 78 05/01/23 0924  Resp    SpO2 99 % 05/01/23 0924  Vitals shown include unvalidated device data.  Last Pain:  Vitals:   05/01/23 0656  TempSrc: Oral  PainSc: 0-No pain         Complications: No notable events documented.

## 2023-05-01 NOTE — Anesthesia Preprocedure Evaluation (Addendum)
Anesthesia Evaluation  Patient identified by MRN, date of birth, ID band Patient awake    Reviewed: Allergy & Precautions, NPO status , Patient's Chart, lab work & pertinent test results  History of Anesthesia Complications (+) PONV and history of anesthetic complications  Airway Mallampati: II  TM Distance: >3 FB Neck ROM: Full    Dental  (+) Teeth Intact, Dental Advisory Given   Pulmonary neg pulmonary ROS   breath sounds clear to auscultation       Cardiovascular negative cardio ROS  Rhythm:Regular Rate:Normal     Neuro/Psych negative neurological ROS  negative psych ROS   GI/Hepatic Neg liver ROS,GERD  ,,  Endo/Other  negative endocrine ROS    Renal/GU negative Renal ROS     Musculoskeletal negative musculoskeletal ROS (+)    Abdominal   Peds  Hematology negative hematology ROS (+)   Anesthesia Other Findings   Reproductive/Obstetrics                             Anesthesia Physical Anesthesia Plan  ASA: 2  Anesthesia Plan: General   Post-op Pain Management: Tylenol PO (pre-op)* and Toradol IV (intra-op)*   Induction: Intravenous  PONV Risk Score and Plan: 4 or greater and Ondansetron, Dexamethasone, Midazolam and Scopolamine patch - Pre-op  Airway Management Planned: LMA  Additional Equipment: None  Intra-op Plan:   Post-operative Plan: Extubation in OR  Informed Consent: I have reviewed the patients History and Physical, chart, labs and discussed the procedure including the risks, benefits and alternatives for the proposed anesthesia with the patient or authorized representative who has indicated his/her understanding and acceptance.     Dental advisory given  Plan Discussed with: CRNA  Anesthesia Plan Comments:        Anesthesia Quick Evaluation

## 2023-05-01 NOTE — Discharge Instructions (Addendum)
  Post Anesthesia Home Care Instructions  Activity: Get plenty of rest for the remainder of the day. A responsible individual must stay with you for 24 hours following the procedure.  For the next 24 hours, DO NOT: -Drive a car -Advertising copywriter -Drink alcoholic beverages -Take any medication unless instructed by your physician -Make any legal decisions or sign important papers.  Meals: Start with liquid foods such as gelatin or soup. Progress to regular foods as tolerated. Avoid greasy, spicy, heavy foods. If nausea and/or vomiting occur, drink only clear liquids until the nausea and/or vomiting subsides. Call your physician if vomiting continues.  Special Instructions/Symptoms: Your throat may feel dry or sore from the anesthesia or the breathing tube placed in your throat during surgery. If this causes discomfort, gargle with warm salt water. The discomfort should disappear within 24 hours.  If you had a scopolamine patch placed behind your ear for the management of post- operative nausea and/or vomiting:  1. The medication in the patch is effective for 72 hours, after which it should be removed.  Wrap patch in a tissue and discard in the trash. Wash hands thoroughly with soap and water. 2. You may remove the patch earlier than 72 hours if you experience unpleasant side effects which may include dry mouth, dizziness or visual disturbances. 3. Avoid touching the patch. Wash your hands with soap and water after contact with the patch  You may have Tylenol again after 1pm today, if needed. You may have Ibuprofen/NSAIDS again after 5:30pm today, if needed

## 2023-05-01 NOTE — Anesthesia Procedure Notes (Signed)
Procedure Name: LMA Insertion Date/Time: 05/01/2023 8:28 AM  Performed by: Burna Cash, CRNAPre-anesthesia Checklist: Patient identified, Emergency Drugs available, Suction available and Patient being monitored Patient Re-evaluated:Patient Re-evaluated prior to induction Oxygen Delivery Method: Circle system utilized Preoxygenation: Pre-oxygenation with 100% oxygen Induction Type: IV induction Ventilation: Mask ventilation without difficulty LMA: LMA inserted LMA Size: 4.0 Number of attempts: 1 Airway Equipment and Method: Bite block Placement Confirmation: positive ETCO2 Tube secured with: Tape Dental Injury: Teeth and Oropharynx as per pre-operative assessment

## 2023-05-01 NOTE — Interval H&P Note (Signed)
History and Physical Interval Note:  05/01/2023 8:02 AM  Tricia Atkins  has presented today for surgery, with the diagnosis of RIGHT BREAST PAPILLOMAS.  The various methods of treatment have been discussed with the patient and family. After consideration of risks, benefits and other options for treatment, the patient has consented to  Procedure(s): RIGHT BREAST LUMPECTOMY WITH RADIOACTIVE SEED LOCALIZATION X4 (Right) as a surgical intervention.  The patient's history has been reviewed, patient examined, no change in status, stable for surgery.  I have reviewed the patient's chart and labs.  Questions were answered to the patient's satisfaction.     [name]

## 2023-05-01 NOTE — Op Note (Signed)
05/01/2023  9:19 AM  PATIENT:  Tricia Atkins  57 y.o. female  PRE-OPERATIVE DIAGNOSIS:  RIGHT BREAST PAPILLOMAS  POST-OPERATIVE DIAGNOSIS:  RIGHT BREAST PAPILLOMAS  PROCEDURE:  Procedure(s): RIGHT BREAST LUMPECTOMY WITH RADIOACTIVE SEED LOCALIZATION X4 (Right)  SURGEON:  Surgeon(s) and Role:    Griselda Miner, MD - Primary  PHYSICIAN ASSISTANT:   ASSISTANTS: none   ANESTHESIA:   local and general  EBL:  minimal   BLOOD ADMINISTERED:none  DRAINS: none   LOCAL MEDICATIONS USED:  MARCAINE     SPECIMEN:  Source of Specimen:  right breast tissue with additional superior margin  DISPOSITION OF SPECIMEN:  PATHOLOGY  COUNTS:  YES  TOURNIQUET:  * No tourniquets in log *  DICTATION: .Dragon Dictation  After informed consent was obtained the patient was brought to the operating room and placed in the supine position on the operating table.  After adequate induction of general anesthesia the patient's right breast was prepped with ChloraPrep, allowed to dry, and draped in usual sterile manner.  An appropriate timeout was performed.  Previously 4 I-125 seeds were placed in the subareolar right breast to mark areas of intraductal papilloma.  The neoprobe was set to I-125 in the area of radioactivity was readily identified.  The area around this was infiltrated with quarter percent Marcaine.  A curvilinear incision was then made with a 15 blade knife along the lower and outer edge of the areola of the right breast.  The incision was carried through the skin and subcutaneous tissue sharply with the electrocautery.  The dissection was then carried around the 4 radioactive seeds while checking the area of radioactivity frequently.  During this dissection we did come across 2 of the seeds and these were removed and sent separately to pathology.  Once the specimen was then removed from the patient it was oriented with the appropriate paint colors.  A specimen radiograph was then obtained that  showed 2 seeds and 3 clips within the specimen.  An additional superior margin was removed and x-rayed as well which showed the final clip all of this tissue was then sent to pathology for further evaluation.  Hemostasis was achieved using the Bovie electrocautery.  The wound was irrigated with saline and infiltrated with more quarter percent Marcaine.  The deep layer of the wound was then closed with layers of interrupted 3-0 Vicryl stitches.  The skin was then closed with interrupted 4-0 Monocryl subcuticular stitches.  Dermabond dressings were applied.  The patient tolerated the procedure well.  At the end of the case all needle sponge and instrument counts were correct.  The patient was then awakened and taken to recovery in stable condition.  PLAN OF CARE: Discharge to home after PACU  PATIENT DISPOSITION:  PACU - hemodynamically stable.   Delay start of Pharmacological VTE agent (>24hrs) due to surgical blood loss or risk of bleeding: not applicable

## 2023-05-04 ENCOUNTER — Encounter (HOSPITAL_BASED_OUTPATIENT_CLINIC_OR_DEPARTMENT_OTHER): Payer: Self-pay | Admitting: General Surgery

## 2023-05-04 LAB — SURGICAL PATHOLOGY

## 2023-05-06 ENCOUNTER — Other Ambulatory Visit: Payer: Self-pay

## 2023-05-06 ENCOUNTER — Encounter (HOSPITAL_BASED_OUTPATIENT_CLINIC_OR_DEPARTMENT_OTHER): Payer: Self-pay | Admitting: Orthopaedic Surgery

## 2023-05-08 NOTE — H&P (Signed)
ORTHOPAEDIC SURGERY H&P  Subjective:  The patient presents with left ankle instability.   Past Medical History:  Diagnosis Date   Elevated cholesterol    Family history of colon cancer    Family history of stomach cancer    GERD (gastroesophageal reflux disease)    Hyperlipidemia    PONV (postoperative nausea and vomiting)    Precordial pain     Past Surgical History:  Procedure Laterality Date   BREAST BIOPSY Right 03/03/2023   [place] RT BREAST BX W LOC DEV 1ST LESION IMG BX SPEC US GUIDE 03/03/2023 [facility]   BREAST BIOPSY  04/30/2023   MM RT RADIOACTIVE SEED EA ADD LESION LOC MAMMO GUIDE 04/30/2023 [facility]   BREAST BIOPSY  04/30/2023   MM RT RADIOACTIVE SEED LOC MAMMO GUIDE 04/30/2023 [facility]   BREAST BIOPSY  04/30/2023   MM RT RADIOACTIVE SEED EA ADD LESION LOC MAMMO GUIDE 04/30/2023 [facility]   BREAST BIOPSY  04/30/2023   MM RT RADIOACTIVE SEED EA ADD LESION LOC MAMMO GUIDE 04/30/2023 [facility]   BREAST LUMPECTOMY WITH RADIOACTIVE SEED LOCALIZATION Right 05/01/2023   Procedure: RIGHT BREAST LUMPECTOMY WITH RADIOACTIVE SEED LOCALIZATION X4;  Surgeon: Griselda Miner, MD;  Location: [facility];  Service: General;  Laterality: Right;   ENDOMETRIAL ABLATION     INCONTINENCE SURGERY     TOTAL ABDOMINAL HYSTERECTOMY     TUBAL LIGATION       (Not in an outpatient encounter)    Allergies  Allergen Reactions   Gluten Meal Other (See Comments)    Headaches and constipation    Social History   Socioeconomic History   Marital status: Married    Spouse name: Not on file   Number of children: 2   Years of education: Not on file   Highest education level: Not on file  Occupational History   Not on file  Tobacco Use   Smoking status: Never   Smokeless tobacco: Never  Vaping Use   Vaping Use: Never used  Substance and Sexual Activity   Alcohol use: Yes    Alcohol/week: 1.0 standard drink of alcohol    Types: 1 Glasses of  wine per week    Comment: occasionally   Drug use: Never   Sexual activity: Not on file  Other Topics Concern   Not on file  Social History Narrative   Not on file   Social Determinants of Health   Financial Resource Strain: Not on file  Food Insecurity: Not on file  Transportation Needs: Not on file  Physical Activity: Not on file  Stress: Not on file  Social Connections: Not on file  Intimate Partner Violence: Not on file     History reviewed. No pertinent family history.   Review of Systems Pertinent items are noted in HPI.  Objective: Vital signs in last 24 hours:    05/06/2023    2:17 PM 05/01/2023   10:28 AM 05/01/2023   10:15 AM  Vitals with BMI  Height 5\' 4"     Weight 159 lbs    BMI 27.28    Systolic  129 120  Diastolic  93 71  Pulse  62 62      EXAM: General: Well nourished, well developed. Awake, alert and oriented to time, place, person. Normal mood and affect. No apparent distress. Breathing room air.  Operative Lower Extremity: Alignment - Neutral Deformity - None Skin intact Tenderness to palpation - left ankle LLC 5/5 TA, PT, GS,  Per, EHL, FHL Sensation intact to light touch throughout Palpable DP and PT pulses Special testing: None  The contralateral foot/ankle was examined for comparison and noted to be neurovascularly intact with no localized deformity, swelling, or tenderness.  Imaging Review All images taken were independently reviewed by me.  Assessment/Plan: The clinical and radiographic findings were reviewed and discussed at length with the patient.  The patient has left ankle instability.  We spoke at length about the natural course of these findings. We discussed nonoperative and operative treatment options in detail.  The risks and benefits were presented and reviewed. The risks due to implant failure/irritation, infection, stiffness, nerve/vessel/tendon injury, wound healing issues, failure of this surgery, need for further  surgery, thromboembolic events, amputation, death among others were discussed. The patient acknowledged the explanation and agreed to proceed with the plan.  Tricia Atkins  Orthopaedic Surgery [facility]

## 2023-05-08 NOTE — Discharge Instructions (Signed)
Armond Hang, MD [facility]  Please read the following information regarding your care after surgery.  Medications  You only need a prescription for the narcotic pain medicine (ex. oxycodone, Percocet, Norco).  All of the other medicines listed below are available over the counter. ? Aleve 2 pills twice a day for the first 3 days after surgery. ? acetominophen (Tylenol) 650 mg every 4-6 hours as you need for minor to moderate pain ? oxycodone as prescribed for severe pain  ? To help prevent blood clots, take aspirin (81 mg) twice daily for 42 days after surgery (or total duration of nonweightbearing).  You should also get up every hour while you are awake to move around.  Weight Bearing ? Do NOT bear any weight on the operated leg or foot. This means do NOT touch your surgical leg to the ground!  Cast / Splint / Dressing ? If you have a splint, do NOT remove this. Keep your splint, cast or dressing clean and dry.  Don't put anything (coat hanger, pencil, etc) down inside of it.  If it gets wet, call the office immediately to schedule an appointment for a cast change.  Swelling IMPORTANT: It is normal for you to have swelling where you had surgery. To reduce swelling and pain, keep at least 3 pillows under your leg so that your toes are above your nose and your heel is above the level of your hip.  It may be necessary to keep your foot or leg elevated for several weeks.  This is critical to helping your incisions heal and your pain to feel better.  Follow Up Call my office at [phone] when you are discharged from the hospital or surgery center to schedule an appointment to be seen 7-10 days after surgery.  Call my office at [phone] if you develop a fever >101.5 F, nausea, vomiting, bleeding from the surgical site or severe pain.     Post Anesthesia Home Care Instructions  Activity: Get plenty of rest for the remainder of the day. A responsible individual must stay with  you for 24 hours following the procedure.  For the next 24 hours, DO NOT: -Drive a car -Paediatric nurse -Drink alcoholic beverages -Take any medication unless instructed by your physician -Make any legal decisions or sign important papers.  Meals: Start with liquid foods such as gelatin or soup. Progress to regular foods as tolerated. Avoid greasy, spicy, heavy foods. If nausea and/or vomiting occur, drink only clear liquids until the nausea and/or vomiting subsides. Call your physician if vomiting continues.  Special Instructions/Symptoms: Your throat may feel dry or sore from the anesthesia or the breathing tube placed in your throat during surgery. If this causes discomfort, gargle with warm salt water. The discomfort should disappear within 24 hours.  If you had a scopolamine patch placed behind your ear for the management of post- operative nausea and/or vomiting:  1. The medication in the patch is effective for 72 hours, after which it should be removed.  Wrap patch in a tissue and discard in the trash. Wash hands thoroughly with soap and water. 2. You may remove the patch earlier than 72 hours if you experience unpleasant side effects which may include dry mouth, dizziness or visual disturbances. 3. Avoid touching the patch. Wash your hands with soap and water after contact with the patch.     Regional Anesthesia Blocks  1. Numbness or the inability to move the "blocked" extremity may last from 3-48 hours after placement.  The length of time depends on the medication injected and your individual response to the medication. If the numbness is not going away after 48 hours, call your surgeon.  2. The extremity that is blocked will need to be protected until the numbness is gone and the  Strength has returned. Because you cannot feel it, you will need to take extra care to avoid injury. Because it may be weak, you may have difficulty moving it or using it. You may not know what position  it is in without looking at it while the block is in effect.  3. For blocks in the legs and feet, returning to weight bearing and walking needs to be done carefully. You will need to wait until the numbness is entirely gone and the strength has returned. You should be able to move your leg and foot normally before you try and bear weight or walk. You will need someone to be with you when you first try to ensure you do not fall and possibly risk injury.  4. Bruising and tenderness at the needle site are common side effects and will resolve in a few days.  5. Persistent numbness or new problems with movement should be communicated to the surgeon or the [facility] [phone] [facility] [phone]).  Can have tylenol after 2:45 pm if needed

## 2023-05-13 ENCOUNTER — Ambulatory Visit (HOSPITAL_BASED_OUTPATIENT_CLINIC_OR_DEPARTMENT_OTHER): Payer: 59 | Admitting: Certified Registered"

## 2023-05-13 ENCOUNTER — Encounter (HOSPITAL_BASED_OUTPATIENT_CLINIC_OR_DEPARTMENT_OTHER): Payer: Self-pay | Admitting: Orthopaedic Surgery

## 2023-05-13 ENCOUNTER — Other Ambulatory Visit (HOSPITAL_COMMUNITY): Payer: Self-pay

## 2023-05-13 ENCOUNTER — Other Ambulatory Visit: Payer: Self-pay

## 2023-05-13 ENCOUNTER — Encounter (HOSPITAL_BASED_OUTPATIENT_CLINIC_OR_DEPARTMENT_OTHER)
Admission: RE | Disposition: A | Discharge status: Home / Self Care | Payer: Self-pay | Source: Home / Self Care | Attending: Orthopaedic Surgery

## 2023-05-13 ENCOUNTER — Ambulatory Visit (HOSPITAL_BASED_OUTPATIENT_CLINIC_OR_DEPARTMENT_OTHER): Payer: 59

## 2023-05-13 ENCOUNTER — Ambulatory Visit (HOSPITAL_BASED_OUTPATIENT_CLINIC_OR_DEPARTMENT_OTHER)
Admission: RE | Admit: 2023-05-13 | Discharge: 2023-05-13 | Disposition: A | Discharge status: Home / Self Care | Payer: 59 | Attending: Orthopaedic Surgery | Admitting: Orthopaedic Surgery

## 2023-05-13 DIAGNOSIS — S93492A Sprain of other ligament of left ankle, initial encounter: Secondary | ICD-10-CM | POA: Diagnosis not present

## 2023-05-13 DIAGNOSIS — S93402A Sprain of unspecified ligament of left ankle, initial encounter: Secondary | ICD-10-CM | POA: Insufficient documentation

## 2023-05-13 DIAGNOSIS — M25372 Other instability, left ankle: Secondary | ICD-10-CM | POA: Insufficient documentation

## 2023-05-13 DIAGNOSIS — X58XXXA Exposure to other specified factors, initial encounter: Secondary | ICD-10-CM | POA: Insufficient documentation

## 2023-05-13 DIAGNOSIS — Z01818 Encounter for other preprocedural examination: Secondary | ICD-10-CM

## 2023-05-13 DIAGNOSIS — M7672 Peroneal tendinitis, left leg: Secondary | ICD-10-CM

## 2023-05-13 DIAGNOSIS — G8918 Other acute postprocedural pain: Secondary | ICD-10-CM | POA: Diagnosis not present

## 2023-05-13 HISTORY — PX: ANKLE ARTHROSCOPY: SHX545

## 2023-05-13 SURGERY — ARTHROSCOPY, ANKLE
Anesthesia: General | Site: Ankle | Laterality: Left

## 2023-05-13 MED ORDER — ACETAMINOPHEN 500 MG PO TABS
ORAL_TABLET | ORAL | Status: AC
  Filled 2023-05-13: qty 2

## 2023-05-13 MED ORDER — PROPOFOL 500 MG/50ML IV EMUL
INTRAVENOUS | Status: DC | PRN
  Administered 2023-05-13: 160 ug/kg/min via INTRAVENOUS

## 2023-05-13 MED ORDER — ACETAMINOPHEN 500 MG PO TABS
1000.0000 mg | ORAL_TABLET | Freq: Once | ORAL | Status: AC
  Administered 2023-05-13: 1000 mg via ORAL

## 2023-05-13 MED ORDER — BUPIVACAINE-EPINEPHRINE (PF) 0.5% -1:200000 IJ SOLN
INTRAMUSCULAR | Status: DC | PRN
  Administered 2023-05-13: 30 mL via PERINEURAL

## 2023-05-13 MED ORDER — 0.9 % SODIUM CHLORIDE (POUR BTL) OPTIME
TOPICAL | Status: DC | PRN
  Administered 2023-05-13: 400 mL

## 2023-05-13 MED ORDER — OXYCODONE HCL 5 MG PO TABS
5.0000 mg | ORAL_TABLET | ORAL | 0 refills | Status: AC
  Filled 2023-05-13: qty 40, 7d supply, fill #0

## 2023-05-13 MED ORDER — MIDAZOLAM HCL 2 MG/2ML IJ SOLN
2.0000 mg | Freq: Once | INTRAMUSCULAR | Status: AC
  Administered 2023-05-13: 2 mg via INTRAVENOUS

## 2023-05-13 MED ORDER — VANCOMYCIN HCL 500 MG IV SOLR
INTRAVENOUS | Status: DC | PRN
  Administered 2023-05-13: 500 mg

## 2023-05-13 MED ORDER — DOCUSATE SODIUM 100 MG PO CAPS: ORAL_CAPSULE | ORAL | 0 refills | Status: AC

## 2023-05-13 MED ORDER — SCOPOLAMINE 1 MG/3DAYS TD PT72
MEDICATED_PATCH | TRANSDERMAL | Status: AC
  Filled 2023-05-13: qty 1

## 2023-05-13 MED ORDER — ONDANSETRON HCL 4 MG PO TABS
4.0000 mg | ORAL_TABLET | Freq: Two times a day (BID) | ORAL | 0 refills | Status: AC | PRN
  Filled 2023-05-13: qty 14, 7d supply, fill #0

## 2023-05-13 MED ORDER — PROPOFOL 10 MG/ML IV BOLUS
INTRAVENOUS | Status: DC | PRN
  Administered 2023-05-13: 200 mg via INTRAVENOUS

## 2023-05-13 MED ORDER — LACTATED RINGERS IV SOLN: INTRAVENOUS | Status: DC

## 2023-05-13 MED ORDER — SCOPOLAMINE 1 MG/3DAYS TD PT72
1.0000 | MEDICATED_PATCH | TRANSDERMAL | Status: DC
  Administered 2023-05-13: 1.5 mg via TRANSDERMAL

## 2023-05-13 MED ORDER — MIDAZOLAM HCL 2 MG/2ML IJ SOLN
INTRAMUSCULAR | Status: AC
  Filled 2023-05-13: qty 2

## 2023-05-13 MED ORDER — CEFAZOLIN SODIUM-DEXTROSE 2-4 GM/100ML-% IV SOLN
2.0000 g | INTRAVENOUS | Status: AC
  Administered 2023-05-13: 2 g via INTRAVENOUS

## 2023-05-13 MED ORDER — ONDANSETRON HCL 4 MG/2ML IJ SOLN
INTRAMUSCULAR | Status: DC | PRN
  Administered 2023-05-13: 4 mg via INTRAVENOUS

## 2023-05-13 MED ORDER — HYDROMORPHONE HCL 1 MG/ML IJ SOLN: 0.2500 mg | INTRAMUSCULAR | Status: DC | PRN

## 2023-05-13 MED ORDER — ASPIRIN 81 MG PO TBEC: 81.0000 mg | DELAYED_RELEASE_TABLET | Freq: Two times a day (BID) | ORAL | 0 refills | Status: AC

## 2023-05-13 MED ORDER — FENTANYL CITRATE (PF) 100 MCG/2ML IJ SOLN
INTRAMUSCULAR | Status: AC
  Filled 2023-05-13: qty 2

## 2023-05-13 MED ORDER — VANCOMYCIN HCL 500 MG IV SOLR
INTRAVENOUS | Status: AC
  Filled 2023-05-13: qty 10

## 2023-05-13 MED ORDER — LIDOCAINE HCL (CARDIAC) PF 100 MG/5ML IV SOSY
PREFILLED_SYRINGE | INTRAVENOUS | Status: DC | PRN
  Administered 2023-05-13: 20 mg via INTRAVENOUS

## 2023-05-13 MED ORDER — DEXAMETHASONE SODIUM PHOSPHATE 10 MG/ML IJ SOLN
INTRAMUSCULAR | Status: DC | PRN
  Administered 2023-05-13: 4 mg via INTRAVENOUS

## 2023-05-13 MED ORDER — CHLORHEXIDINE GLUCONATE 4 % EX SOLN: 60.0000 mL | Freq: Once | CUTANEOUS | Status: DC

## 2023-05-13 MED ORDER — BUPIVACAINE-EPINEPHRINE (PF) 0.5% -1:200000 IJ SOLN
INTRAMUSCULAR | Status: AC
  Filled 2023-05-13: qty 30

## 2023-05-13 MED ORDER — BUPIVACAINE HCL (PF) 0.5 % IJ SOLN
INTRAMUSCULAR | Status: DC | PRN
  Administered 2023-05-13: 10 mL via PERINEURAL

## 2023-05-13 MED ORDER — PHENYLEPHRINE HCL (PRESSORS) 10 MG/ML IV SOLN
INTRAVENOUS | Status: DC | PRN
  Administered 2023-05-13 (×6): 40 ug via INTRAVENOUS

## 2023-05-13 MED ORDER — FENTANYL CITRATE (PF) 100 MCG/2ML IJ SOLN
100.0000 ug | Freq: Once | INTRAMUSCULAR | Status: AC
  Administered 2023-05-13: 100 ug via INTRAVENOUS

## 2023-05-13 MED ORDER — CEFAZOLIN SODIUM-DEXTROSE 2-4 GM/100ML-% IV SOLN
INTRAVENOUS | Status: AC
  Filled 2023-05-13: qty 100

## 2023-05-13 SURGICAL SUPPLY — 84 items
ANCH SUT NDL DX FBRTK STRL LF (Anchor) ×1 IMPLANT
ANCH SUT NDL STRL LF DX FBRTK (Anchor) ×1 IMPLANT
ANCHOR SUT FBRTK 1.3 SUTTAP (Anchor) IMPLANT
ANCHOR SUT FIBERTAK DX DBL (Anchor) IMPLANT
APL PRP STRL LF DISP 70% ISPRP (MISCELLANEOUS) ×1
ASCP RGD 125 NANO NDL SCP (MISCELLANEOUS) ×1
BANDAGE ESMARK 6X9 LF (GAUZE/BANDAGES/DRESSINGS) ×1 IMPLANT
BLADE AVERAGE 25X9 (BLADE) IMPLANT
BLADE SURG 15 STRL LF DISP TIS (BLADE) ×2 IMPLANT
BLADE SURG 15 STRL SS (BLADE) ×2
BNDG CMPR 5X4 CHSV STRCH STRL (GAUZE/BANDAGES/DRESSINGS) ×1
BNDG CMPR 5X4 KNIT ELC UNQ LF (GAUZE/BANDAGES/DRESSINGS) ×1
BNDG CMPR 5X62 HK CLSR LF (GAUZE/BANDAGES/DRESSINGS)
BNDG CMPR 6"X 5 YARDS HK CLSR (GAUZE/BANDAGES/DRESSINGS)
BNDG CMPR 9X6 STRL LF SNTH (GAUZE/BANDAGES/DRESSINGS) ×1
BNDG CMPR THK2 5X4 CHSV NS (GAUZE/BANDAGES/DRESSINGS) ×1
BNDG COHESIVE 4X5 TAN NS LF (GAUZE/BANDAGES/DRESSINGS) ×1 IMPLANT
BNDG COHESIVE 4X5 TAN STRL LF (GAUZE/BANDAGES/DRESSINGS) ×1 IMPLANT
BNDG ELASTIC 4INX 5YD STR LF (GAUZE/BANDAGES/DRESSINGS) ×1 IMPLANT
BNDG ELASTIC 6INX 5YD STR LF (GAUZE/BANDAGES/DRESSINGS) IMPLANT
BNDG ESMARK 6X9 LF (GAUZE/BANDAGES/DRESSINGS) ×1
BNDG GAUZE DERMACEA FLUFF 4 (GAUZE/BANDAGES/DRESSINGS) ×1 IMPLANT
BNDG GZE DERMACEA 4 6PLY (GAUZE/BANDAGES/DRESSINGS) ×1
CHLORAPREP W/TINT 26 (MISCELLANEOUS) ×1 IMPLANT
COVER BACK TABLE 60X90IN (DRAPES) ×1 IMPLANT
CUFF TOURN SGL QUICK 34 (TOURNIQUET CUFF)
CUFF TRNQT CYL 34X4.125X (TOURNIQUET CUFF) IMPLANT
DRAPE EXTREMITY T 121X128X90 (DISPOSABLE) ×1 IMPLANT
DRAPE OEC MINIVIEW 54X84 (DRAPES) IMPLANT
DRAPE U-SHAPE 47X51 STRL (DRAPES) ×1 IMPLANT
DRSG MEPITEL 4X7.2 (GAUZE/BANDAGES/DRESSINGS) ×1 IMPLANT
ELECT REM PT RETURN 9FT ADLT (ELECTROSURGICAL) ×1
ELECTRODE REM PT RTRN 9FT ADLT (ELECTROSURGICAL) ×1 IMPLANT
GAUZE PAD ABD 8X10 STRL (GAUZE/BANDAGES/DRESSINGS) ×2 IMPLANT
GAUZE SPONGE 4X4 12PLY STRL (GAUZE/BANDAGES/DRESSINGS) ×1 IMPLANT
GLOVE BIOGEL PI IND STRL 8 (GLOVE) ×2 IMPLANT
GLOVE SURG SS PI 7.5 STRL IVOR (GLOVE) ×2 IMPLANT
GOWN STRL REUS W/ TWL LRG LVL3 (GOWN DISPOSABLE) ×2 IMPLANT
GOWN STRL REUS W/TWL LRG LVL3 (GOWN DISPOSABLE) ×2
KIT FIBERTAK DX 1.6 DISP (KITS) IMPLANT
NANONEEDLE HIGHFLOW SHEATH 125 (SHEATH) ×1
NDL HYPO 22X1.5 SAFETY MO (MISCELLANEOUS) IMPLANT
NDL HYPO 25X1 1.5 SAFETY (NEEDLE) IMPLANT
NDL SUT 6 .5 CRC .975X.05 MAYO (NEEDLE) IMPLANT
NEEDLE HYPO 22X1.5 SAFETY MO (MISCELLANEOUS) IMPLANT
NEEDLE HYPO 25X1 1.5 SAFETY (NEEDLE) IMPLANT
NEEDLE MAYO TAPER (NEEDLE)
NS IRRIG 1000ML POUR BTL (IV SOLUTION) ×1 IMPLANT
PACK BASIN DAY SURGERY FS (CUSTOM PROCEDURE TRAY) ×1 IMPLANT
PAD CAST 4YDX4 CTTN HI CHSV (CAST SUPPLIES) ×1 IMPLANT
PADDING CAST ABS COTTON 4X4 ST (CAST SUPPLIES) IMPLANT
PADDING CAST COTTON 4X4 STRL (CAST SUPPLIES) ×1
PADDING CAST SYNTHETIC 4X4 STR (CAST SUPPLIES) ×2 IMPLANT
PASSER SUT SWANSON 36MM LOOP (INSTRUMENTS) IMPLANT
PENCIL SMOKE EVACUATOR (MISCELLANEOUS) ×1 IMPLANT
RETRIEVER SUT HEWSON (MISCELLANEOUS) IMPLANT
SANITIZER HAND PURELL FF 515ML (MISCELLANEOUS) ×1 IMPLANT
SCOPE NANONDL 125 (MISCELLANEOUS) IMPLANT
SCOPE NANONEEDLE 125 (MISCELLANEOUS) ×1 IMPLANT
SET IRRIG Y TYPE TUR BLADDER L (SET/KITS/TRAYS/PACK) IMPLANT
SHEATH NANONDL HIGHFLOW 125 (SHEATH) IMPLANT
SHEATH NANONEEDLE HIGHFLOW 125 (SHEATH) ×1 IMPLANT
SHEET MEDIUM DRAPE 40X70 STRL (DRAPES) ×1 IMPLANT
SLEEVE SCD COMPRESS KNEE MED (STOCKING) ×1 IMPLANT
SPIKE FLUID TRANSFER (MISCELLANEOUS) IMPLANT
SPLINT PLASTER CAST FAST 5X30 (CAST SUPPLIES) ×20 IMPLANT
SPONGE T-LAP 18X18 ~~LOC~~+RFID (SPONGE) ×1 IMPLANT
STOCKINETTE 6  STRL (DRAPES) ×1
STOCKINETTE 6 STRL (DRAPES) ×1 IMPLANT
SUCTION FRAZIER HANDLE 10FR (MISCELLANEOUS) ×1
SUCTION TUBE FRAZIER 10FR DISP (MISCELLANEOUS) ×1 IMPLANT
SUT ETHIBOND 2 OS 4 DA (SUTURE) IMPLANT
SUT ETHILON 2 0 FS 18 (SUTURE) ×1 IMPLANT
SUT FIBERWIRE 2-0 18 17.9 3/8 (SUTURE)
SUT MERSILENE 2.0 SH NDLE (SUTURE) IMPLANT
SUT MNCRL AB 3-0 PS2 18 (SUTURE) ×1 IMPLANT
SUT VIC AB 2-0 SH 27 (SUTURE)
SUT VIC AB 2-0 SH 27XBRD (SUTURE) IMPLANT
SUT VICRYL 0 SH 27 (SUTURE) IMPLANT
SUTURE FIBERWR 2-0 18 17.9 3/8 (SUTURE) IMPLANT
SYR BULB EAR ULCER 3OZ GRN STR (SYRINGE) ×1 IMPLANT
TOWEL GREEN STERILE FF (TOWEL DISPOSABLE) ×2 IMPLANT
TUBE CONNECTING 20X1/4 (TUBING) ×1 IMPLANT
UNDERPAD 30X36 HEAVY ABSORB (UNDERPADS AND DIAPERS) ×1 IMPLANT

## 2023-05-13 NOTE — Anesthesia Procedure Notes (Signed)
Procedure Name: LMA Insertion Date/Time: 05/13/2023 9:52 AM  Performed by: Letetia Romanello, Jewel Baize, CRNAPre-anesthesia Checklist: Patient identified, Emergency Drugs available, Suction available and Patient being monitored Patient Re-evaluated:Patient Re-evaluated prior to induction Oxygen Delivery Method: Circle system utilized Preoxygenation: Pre-oxygenation with 100% oxygen Induction Type: IV induction Ventilation: Mask ventilation without difficulty LMA: LMA inserted LMA Size: 4.0 Number of attempts: 1 Airway Equipment and Method: Bite block Placement Confirmation: positive ETCO2 Tube secured with: Tape Dental Injury: Teeth and Oropharynx as per pre-operative assessment

## 2023-05-13 NOTE — Anesthesia Procedure Notes (Signed)
Anesthesia Regional Block: Popliteal block   Pre-Anesthetic Checklist: , timeout performed,  Correct Patient, Correct Site, Correct Laterality,  Correct Procedure, Correct Position, site marked,  Risks and benefits discussed,  Pre-op evaluation,  At surgeon's request and post-op pain management  Laterality: Left  Prep: Maximum Sterile Barrier Precautions used, chloraprep       Needles:  Injection technique: Single-shot  Needle Type: Echogenic Stimulator Needle     Needle Length: 9cm  Needle Gauge: 21     Additional Needles:   Procedures:,,,, ultrasound used (permanent image in chart),,    Narrative:  Start time: 05/13/2023 8:45 AM End time: 05/13/2023 8:55 AM Injection made incrementally with aspirations every 5 mL.  Performed by: Personally  Anesthesiologist: Gaynelle Adu, MD

## 2023-05-13 NOTE — Op Note (Signed)
05/13/2023  11:24 AM   PATIENT: Tricia Atkins  57 y.o. 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   INDICATION FOR PROCEDURE: The patient presented with above diagnosis.  We discussed the diagnosis, alternative treatment options, risks and benefits of the above surgical intervention, as well as alternative non-operative treatments. All questions/concerns were addressed and the patient/family demonstrated appropriate understanding of the diagnosis, the procedure, the postoperative course, and overall prognosis. The patient wished to proceed with surgical intervention and signed an informed surgical consent as such, in each others presence prior to surgery.   PROCEDURE IN DETAIL: After preoperative consent was obtained and the correct operative site was identified, the patient was brought to the operating room supine on stretcher and transferred onto operating table. General anesthesia was induced. Preoperative antibiotics were administered. Surgical timeout was taken. The patient was then positioned supine with an ipsilateral hip bump. The operative lower extremity was prepped and draped in standard sterile fashion with a tourniquet around the thigh. The extremity was exsanguinated and the tourniquet was inflated to 275  mmHg.  Standard routine evaluation of the ankle joint demonstrated gross laxity with drawer testing. Full dorsiflexion as well as plantarflexion was possible.   We began by insufflating the ankle joint thru anteromedial approach. The anteromedial portal was carefully established medial to the tibialis anterior tendon. The arthroscopic trochar with blunt was inserted and then camera placed. There was excellent visualization of the joint and routine diagnostic ankle arthroscopy was performed. Of note, there was mild synovitis throughout the joint but no chondral changes in the tibiotalar joint surfaces. No loose bodies were encountered and no anterior ankle impingement was identified on max dorsiflexion. The deltoid and syndesmosis ligaments were stressed and noted to be stable. Arthroscopic assisted debridement of the ankle joint was performed.    A standard curvilinear approach was made over the lateral ankle ligament complex and the distal lateral malleolus.   We began by windowing the approach posteriorly to access the peroneal tendons. The sheath was incised and tenosynovial fluid was readily evacuated. We then sequentially evaluated both the peroneus longus and brevis tendons with no tearing noted. We did note extensive tenosynovitis that was debrided thoroughly. There was no instability of peroneal tendons to intraoperative stress testing. The peroneal tendon sheath was close with vicryl.  Dissection was the carried down to the level of the lateral ankle ligament complex. We sharply incised the capsule and the complex just distal to the tip of the fibula leaving a small cuff of tissue for repair after advancement. The proximal flap was elevated carefully off the lateral malleolus. We then used a rongeur to roughen the distal lateral malleolus. Two Arthrex FiberTak anchors were implanted using standard technique in the anatomic footprints of the ATFL and CFL ligaments. These were verified by manual  stress to be well seated within bone. The suture needles  were sequentially passed through the distal flap, tied, and then brought back proximally into the proximal flap were they were then tied.The foot was held in eversion throughout to set appropriate tension and protect the repair. Intraoperative ankle testing demonstrated improved stability of the newly reconstructed lateral ankle ligament complex.   The surgical sites were thoroughly irrigated. The tourniquet was deflated and hemostasis achieved. Betadine and vancomycin powder applied. The deep layers were closed using 2-0 vicryl. The skin was closed without tension using 2-0 nylon suture.    The leg was cleaned with saline and sterile mepitel dressings with gauze were applied. A well padded short leg splint was applied. The patient was awakened from anesthesia and transported to the recovery room in stable condition.    FOLLOW UP PLAN: -transfer to PACU, then home -strict NWB operative extremity, maximum elevation -maintain short leg splint until follow up -DVT ppx: Aspirin 81 mg twice daily while NWB -follow up as outpatient Mon May 20th at 1:30 PM in [place] for wound check with exchange of short leg splint to short leg cast -sutures out in 2-3 weeks in outpatient office   RADIOGRAPHS: None used   Netta Cedars Orthopaedic Surgery [facility]

## 2023-05-13 NOTE — Progress Notes (Signed)
Assisted Dr. Edmond Fitzgerald with left, adductor canal, popliteal, ultrasound guided block. Side rails up, monitors on throughout procedure. See vital signs in flow sheet. Tolerated Procedure well. 

## 2023-05-13 NOTE — Anesthesia Preprocedure Evaluation (Addendum)
Anesthesia Evaluation  Patient identified by MRN, date of birth, ID band Patient awake    Reviewed: Allergy & Precautions, H&P , NPO status , Patient's Chart, lab work & pertinent test results  History of Anesthesia Complications (+) PONV and history of anesthetic complications  Airway Mallampati: II  TM Distance: >3 FB Neck ROM: Full    Dental no notable dental hx. (+) Teeth Intact, Dental Advisory Given   Pulmonary neg pulmonary ROS   Pulmonary exam normal breath sounds clear to auscultation       Cardiovascular negative cardio ROS  Rhythm:Regular Rate:Normal     Neuro/Psych negative neurological ROS  negative psych ROS   GI/Hepatic Neg liver ROS,GERD  Medicated,,  Endo/Other  negative endocrine ROS    Renal/GU negative Renal ROS  negative genitourinary   Musculoskeletal   Abdominal   Peds  Hematology negative hematology ROS (+)   Anesthesia Other Findings   Reproductive/Obstetrics negative OB ROS                             Anesthesia Physical Anesthesia Plan  ASA: 2  Anesthesia Plan: General   Post-op Pain Management: Regional block* and Tylenol PO (pre-op)*   Induction: Intravenous  PONV Risk Score and Plan: 4 or greater and Ondansetron, Dexamethasone, Propofol infusion, TIVA and Midazolam  Airway Management Planned: LMA  Additional Equipment:   Intra-op Plan:   Post-operative Plan: Extubation in OR  Informed Consent: I have reviewed the patients History and Physical, chart, labs and discussed the procedure including the risks, benefits and alternatives for the proposed anesthesia with the patient or authorized representative who has indicated his/her understanding and acceptance.     Dental advisory given  Plan Discussed with: CRNA  Anesthesia Plan Comments:        Anesthesia Quick Evaluation

## 2023-05-13 NOTE — Transfer of Care (Signed)
Immediate Anesthesia Transfer of Care Note  Patient: Tricia Atkins  Procedure(s) Performed: left lateral ankle stabilization with arthroscopic debridement, possible peroneal tendon debridement and/or tendon transfer (Left: Ankle)  Patient Location: PACU  Anesthesia Type:GA combined with regional for post-op pain  Level of Consciousness: drowsy and patient cooperative  Airway & Oxygen Therapy: Patient Spontanous Breathing and Patient connected to face mask oxygen  Post-op Assessment: Report given to RN and Post -op Vital signs reviewed and stable  Post vital signs: Reviewed and stable  Last Vitals:  Vitals Value Taken Time  BP    Temp    Pulse 83 05/13/23 1114  Resp 23 05/13/23 1114  SpO2 96 % 05/13/23 1114  Vitals shown include unvalidated device data.  Last Pain:  Vitals:   05/13/23 0802  PainSc: 0-No pain      Patients Stated Pain Goal: 3 (05/13/23 0802)  Complications: No notable events documented.

## 2023-05-13 NOTE — H&P (Signed)
H&P Update:  -History and Physical Reviewed  -Patient has been re-examined  -No change in the plan of care  -The risks and benefits were presented and reviewed. The risks due to arthroscopy, hardware/suture failure and/or irritation, new/persistent infection, stiffness, nerve/vessel/tendon injury or rerupture of repaired tendon, nonunion/malunion, allograft usage, wound healing issues, development of arthritis, failure of this surgery, possibility of external fixation with delayed definitive surgery, need for further surgery, thromboembolic events, anesthesia/medical complications, amputation, death among others were discussed. The patient acknowledged the explanation, agreed to proceed with the plan and a consent was signed.  Garo Heidelberg  

## 2023-05-13 NOTE — Anesthesia Postprocedure Evaluation (Signed)
Anesthesia Post Note  Patient: ANDRIENNE GROLLER  Procedure(s) Performed: left lateral ankle stabilization with arthroscopic debridement, possible peroneal tendon debridement and/or tendon transfer (Left: Ankle)     Patient location during evaluation: PACU Anesthesia Type: General and Regional Level of consciousness: awake and alert Pain management: pain level controlled Vital Signs Assessment: post-procedure vital signs reviewed and stable Respiratory status: spontaneous breathing, nonlabored ventilation and respiratory function stable Cardiovascular status: blood pressure returned to baseline and stable Postop Assessment: no apparent nausea or vomiting Anesthetic complications: no  No notable events documented.  Last Vitals:  Vitals:   05/13/23 1230 05/13/23 1305  BP: 101/67 107/73  Pulse: 72 71  Resp: 15 18  Temp:  (!) 36.2 C  SpO2: 94% 95%    Last Pain:  Vitals:   05/13/23 1305  PainSc: 0-No pain    LLE Motor Response: No movement due to regional block (05/13/23 1305) LLE Sensation: No sensation (absent) (05/13/23 1305)          Natina Wiginton,W. EDMOND

## 2023-05-13 NOTE — Anesthesia Procedure Notes (Signed)
Anesthesia Regional Block: Adductor canal block   Pre-Anesthetic Checklist: , timeout performed,  Correct Patient, Correct Site, Correct Laterality,  Correct Procedure, Correct Position, site marked,  Risks and benefits discussed,  Pre-op evaluation,  At surgeon's request and post-op pain management  Laterality: Left  Prep: Maximum Sterile Barrier Precautions used, chloraprep       Needles:  Injection technique: Single-shot  Needle Type: Echogenic Stimulator Needle     Needle Length: 9cm  Needle Gauge: 21     Additional Needles:   Procedures:,,,, ultrasound used (permanent image in chart),,    Narrative:  Start time: 05/13/2023 8:55 AM End time: 05/13/2023 8:58 AM Injection made incrementally with aspirations every 5 mL.  Performed by: Personally  Anesthesiologist: Gaynelle Adu, MD  Additional Notes:

## 2023-05-14 ENCOUNTER — Encounter (HOSPITAL_BASED_OUTPATIENT_CLINIC_OR_DEPARTMENT_OTHER): Payer: Self-pay | Admitting: Orthopaedic Surgery

## 2023-05-18 DIAGNOSIS — M7672 Peroneal tendinitis, left leg: Secondary | ICD-10-CM | POA: Diagnosis not present

## 2023-05-18 DIAGNOSIS — Z4789 Encounter for other orthopedic aftercare: Secondary | ICD-10-CM | POA: Diagnosis not present

## 2023-05-18 DIAGNOSIS — S93492D Sprain of other ligament of left ankle, subsequent encounter: Secondary | ICD-10-CM | POA: Diagnosis not present

## 2023-05-19 ENCOUNTER — Inpatient Hospital Stay (HOSPITAL_BASED_OUTPATIENT_CLINIC_OR_DEPARTMENT_OTHER): Payer: 59 | Admitting: Hematology and Oncology

## 2023-05-19 DIAGNOSIS — Z800 Family history of malignant neoplasm of digestive organs: Secondary | ICD-10-CM

## 2023-05-19 DIAGNOSIS — Z803 Family history of malignant neoplasm of breast: Secondary | ICD-10-CM

## 2023-05-19 NOTE — Progress Notes (Signed)
[facility] CONSULT NOTE  Patient Care Team: Marva Panda, NP as PCP - General  CHIEF COMPLAINTS/PURPOSE OF CONSULTATION:  At high risk of breast cancer.  ASSESSMENT & PLAN:  This is a very pleasant 57 year old postmenopausal female patient with some family history of breast cancer in maternal grandmother, father with colon cancer referred to high risk breast clinic.  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  She also had mammograms and MRIs with some non-mass enhancement, indeterminate and enhancing nodules, had breast biopsies which did not show any evidence of malignancy but patient is concerned if the right area was sampled.  We have today discussed about lifetime risk of breast cancer based on Tyrer-Cuzick model which estimated around 12.6% and 5-year risk of breast cancer based on Gail's model which was about 1.5%.  Based on this above-mentioned data, at this time she is not a candidate for MRI based screening or antiestrogen therapy.  I have however discussed about healthy lifestyle interventions such as regular exercise, dietary changes with more plant-based meals as well as self breast exam on a regular basis.  At this time I recommended that we follow-up with a telephone visit to review final pathology after lumpectomy.  She also requested referral to genetics which was placed. If her final pathology is without any evidence of malignancy, she can continue breast cancer screening with her primary care physician as well as breast exams with her primary care physician.  She is completely agreeable to this plan.  Thank you for consulting [place] in the care of this patient.  Please do not hesitate to contact us with any additional questions or concerns.   HISTORY OF PRESENTING ILLNESS:  Tricia Atkins 57 y.o. female is here because of high risk for breast cancer.  This is a very pleasant 57 year old postmenopausal female patient with family  history of breast cancer possibly in maternal grandmother, father with colon cancer referred from primary care's office given her 53 and me testing suggesting lifetime risk of breast cancer greater than 20%.  She tells me that she is not entirely sure but there was be conversation if her maternal grandmother had breast cancer and died at the age of 90.  Other than that her father had metastatic colorectal cancer at the age of 63.  She is healthy at baseline, on amlodipine for vasodilation since she was thought to have coronary vascular disease.  She had multiple breast biopsies recently given some concerns and all of these turned out to be benign but she is unsure if there was sampling better especially because sampling was not done at the area of concerns.  She is following up with Dr. Carolynne Edouard from breast surgery for further recommendations.  Father had colon cancer and had metastatic colon cancer in her early 42's. Paternal grand mother had stomach cancer Maternal grand mother may have had breast cancer. Age at first birth: 26 , Birth control use for more than 5 yrs- yes No recent use of HRT, tried it more than 20 yrs ago Breast feeding, yes for about 3 months.  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 She is dealing with an ankle injury and is on short term disability. Rest of the pertinent 10 point ROS reviewed and negative MEDICAL HISTORY:  Past Medical History:  Diagnosis Date   Elevated cholesterol    Family history of colon cancer    Family history of stomach cancer  GERD (gastroesophageal reflux disease)    Hyperlipidemia    PONV (postoperative nausea and vomiting)    Precordial pain     SURGICAL HISTORY: Past Surgical History:  Procedure Laterality Date   ANKLE ARTHROSCOPY Left 05/13/2023   Procedure: left lateral ankle stabilization with arthroscopic debridement, possible peroneal tendon debridement and/or tendon transfer;  Surgeon: Netta Cedars, MD;  Location:  [facility];  Service: Orthopedics;  Laterality: Left;   BREAST BIOPSY Right 03/03/2023   [place] RT BREAST BX W LOC DEV 1ST LESION IMG BX SPEC US GUIDE 03/03/2023 [facility] MAMMOGRAP[facility]   BREAST BIOPSY  04/30/2023   MM RT RADIOACTIVE SEED EA ADD LESION LOC MAMMO GUIDE 04/30/2023 [facility]AMMOGR[facility]   BREAST BIOPSY  04/30/2023   MM RT RADIOACTIVE SEED LOC MAMMO GUIDE 04/30/2023 [facility]   BREAST BIOPSY  04/30/2023   MM RT RADIOACTIVE SEED EA ADD LESION LOC MAMMO GUIDE 04/30/2023 [facility]   BREAST BIOPSY  04/30/2023   MM RT RADIOACTIVE SEED EA ADD LESION LOC MAMMO GUIDE 04/30/2023 [facility]   BREAST LUMPECTOMY WITH RADIOACTIVE SEED LOCALIZATION Right 05/01/2023   Procedure: RIGHT BREAST LUMPECTOMY WITH RADIOACTIVE SEED LOCALIZATION X4;  Surgeon: Griselda Miner, MD;  Location:  SURGERY CENTER;  Service: General;  Laterality: Right;   ENDOMETRIAL ABLATION     INCONTINENCE SURGERY     TOTAL ABDOMINAL HYSTERECTOMY     TUBAL LIGATION      SOCIAL HISTORY: Social History   Socioeconomic History   Marital status: Married    Spouse name: Not on file   Number of children: 2   Years of education: Not on file   Highest education level: Not on file  Occupational History   Not on file  Tobacco Use   Smoking status: Never   Smokeless tobacco: Never  Vaping Use   Vaping Use: Never used  Substance and Sexual Activity   Alcohol use: Yes    Alcohol/week: 1.0 standard drink of alcohol    Types: 1 Glasses of wine per week    Comment: occasionally   Drug use: Never   Sexual activity: Not on file  Other Topics Concern   Not on file  Social History Narrative   Not on file   Social Determinants of Health   Financial Resource Strain: Not on file  Food Insecurity: Not on file  Transportation Needs: Not on file  Physical Activity: Not on file  Stress: Not on file  Social Connections: Not on file  Intimate Partner Violence: Not on file    FAMILY HISTORY: Family  History  Problem Relation Age of Onset   Hyperlipidemia Mother    Hypertension Mother    Heart attack Father    CAD Father    Colon cancer Father 50       metastatic in early 60s   Hypertension Brother    Breast cancer Maternal Grandmother        unsure if breast cancer vs mastitis, vs something else   Heart disease Maternal Grandmother 75   Stomach cancer Paternal Grandmother     ALLERGIES:  is allergic to gluten meal.  MEDICATIONS:  Current Outpatient Medications  Medication Sig Dispense Refill   acetaminophen (TYLENOL) 500 MG tablet Take 500 mg by mouth every 6 (six) hours as needed.     amLODipine (NORVASC) 2.5 MG tablet Take 1 tablet (2.5 mg total) by mouth daily. 90 tablet 2   aspirin EC (ASPIRIN LOW DOSE) 81 MG tablet Take 1 tablet (  81 mg total) by mouth 2 (two) times daily for 60 days 120 tablet 0   docusate sodium (COLACE) 100 MG capsule Take 1 capsule twice a day by oral route for 28 days. 56 capsule 0   nitroGLYCERIN (NITROSTAT) 0.4 MG SL tablet DISSOLVE 1 TABLET UNDER THE TONGUE EVERY 5 MINUTES FOR UP TO 3 DOSES AS NEEDED FOR CHEST PAIN 25 tablet 3   omeprazole (PRILOSEC) 10 MG capsule Take 1 capsule (10 mg total) by mouth daily. (Patient taking differently: Take 20 mg by mouth daily.) 90 capsule 1   ondansetron (ZOFRAN) 4 MG tablet Take 1 tablet (4 mg total) by mouth 2 (two) times daily as needed. 14 tablet 0   oxyCODONE (OXY IR/ROXICODONE) 5 MG immediate release tablet Take 1 tablet (5 mg total) by mouth every 4-6 hours 40 tablet 0   rosuvastatin (CRESTOR) 20 MG tablet Take 1 tablet (20 mg total) by mouth daily. 90 tablet 1   No current facility-administered medications for this visit.     PHYSICAL EXAMINATION: ECOG PERFORMANCE STATUS: 0 - Asymptomatic  There were no vitals filed for this visit.  There were no vitals filed for this visit.   GENERAL:alert, no distress and comfortable Breast: Bilateral breasts inspected and palpated.  Right breast with  postbiopsy changes from recent biopsy.  Left breast normal to inspection palpation.  No regional adenopathy.  LABORATORY DATA:  I have reviewed the data as listed Lab Results  Component Value Date   HGB 14.3 12/05/2012   HCT 42.0 12/05/2012     Chemistry      Component Value Date/Time   NA 146 (H) 12/05/2012 2302   K 4.0 12/05/2012 2302   CL 110 12/05/2012 2302   BUN 22 12/05/2012 2302   CREATININE 0.80 12/05/2012 2302   No results found for: "CALCIUM", "ALKPHOS", "AST", "ALT", "BILITOT"     RADIOGRAPHIC STUDIES: I have personally reviewed the radiological images as listed and agreed with the findings in the report. DG MINI C-ARM IMAGE ONLY  Result Date: 05/13/2023 There is no interpretation for this exam.  This order is for images obtained during a surgical procedure.  Please See "Surgeries" Tab for more information regarding the procedure.   MM Breast Surgical Specimen  Result Date: 05/01/2023 CLINICAL DATA:  Status post lumpectomy today after earlier multifocal radioactive seed localization (4 sites). EXAM: SPECIMEN RADIOGRAPH OF THE RIGHT BREAST COMPARISON:  Previous exam(s). FINDINGS: Status post excision of the right breast. The 4 radioactive seeds and biopsy marker clips are present and appear completely intact within the specimen. Findings discussed with the OR staff during the procedure IMPRESSION: Specimen radiograph of the right breast. Electronically Signed   By: Bary Richard M.D.   On: 05/01/2023 09:06  MM RT RADIOACTIVE SEED LOC MAMMO GUIDE  Result Date: 04/30/2023 CLINICAL DATA:  Patient presents for radioactive seed localization of 4 recently biopsied right breast lesions, which are in close proximity in the anterior to middle right breast. Three biopsies were performed under MRI guidance, 1 under ultrasound guidance. EXAM: MAMMOGRAPHIC GUIDED RADIOACTIVE SEED LOCALIZATION OF THE RIGHT BREAST: 4 SEEDS PLACED TO LOCALIZE 4 POST BIOPSY MARKER CLIPS. COMPARISON:  Previous  exam(s). FINDINGS: Patient presents for radioactive seed localization prior to surgical excision. I met with the patient and we discussed the procedure of seed localization including benefits and alternatives. We discussed the high likelihood of a successful procedure. We discussed the risks of the procedure including infection, bleeding, tissue injury and further surgery. We discussed  the low dose of radioactivity involved in the procedure. Informed, written consent was given. The usual time-out protocol was performed immediately prior to the procedure. Heart Clip. Using mammographic guidance, sterile technique, 1% lidocaine and an I-125 radioactive seed, the heart shaped post biopsy marker clip in the retroareolar breast was localized using a superior approach. The follow-up mammogram images confirm the seed in the expected location and were marked for Dr. Carolynne Edouard. Follow-up survey of the patient confirms presence of the radioactive seed. Order number of I-125 seed:  [id number]. Total activity:  0.258 millicuries.  Reference Date: 04/06/2023 Dumbbell clip. Using mammographic guidance, sterile technique, 1% lidocaine and an I-125 radioactive seed, the dumbbell shaped post biopsy marker clip was localized using a superior approach. The follow-up mammogram images confirm the seed in the expected location and were marked for Dr. Carolynne Edouard. Follow-up survey of the patient confirms presence of the radioactive seed. Order number of I-125 seed:  [id number]. Total activity:  0.261 millicuries.  Reference Date: 04/03/2023 Cylinder clip. Using mammographic guidance, sterile technique, 1% lidocaine and an I-125 radioactive seed, cylinder shaped post biopsy marker clip was localized using a superior approach. The follow-up mammogram images confirm the seed in the expected location and were marked for Dr. Carolynne Edouard. Follow-up survey of the patient confirms presence of the radioactive seed. Order number of I-125 seed:  [id number]. Total  activity:  0.261 millicuries.  Reference Date: 04/03/2023 Top hat clip. Using mammographic guidance, sterile technique, 1% lidocaine and an I-125 radioactive seed, the top hat shaped was localized using a superior approach. The follow-up mammogram images confirm the seed in the expected location and were marked for Dr. Carolynne Edouard. Follow-up survey of the patient confirms presence of the radioactive seed. Order number of I-125 seed:  [id number]. Total activity:  0.258 millicuries.  Reference Date: 04/06/2023 The patient tolerated the procedure well and was released from the [facility]. She was given instructions regarding seed removal. IMPRESSION: Radioactive seed localization the right breast. Four seeds placed localizing 4 post biopsy marker clips. No apparent complications. Electronically Signed   By: Amie Portland M.D.   On: 04/30/2023 14:28  MM RT RADIO SEED EA ADD LESION LOC MAMMO  Result Date: 04/30/2023 CLINICAL DATA:  Patient presents for radioactive seed localization of 4 recently biopsied right breast lesions, which are in close proximity in the anterior to middle right breast. Three biopsies were performed under MRI guidance, 1 under ultrasound guidance. EXAM: MAMMOGRAPHIC GUIDED RADIOACTIVE SEED LOCALIZATION OF THE RIGHT BREAST: 4 SEEDS PLACED TO LOCALIZE 4 POST BIOPSY MARKER CLIPS. COMPARISON:  Previous exam(s). FINDINGS: Patient presents for radioactive seed localization prior to surgical excision. I met with the patient and we discussed the procedure of seed localization including benefits and alternatives. We discussed the high likelihood of a successful procedure. We discussed the risks of the procedure including infection, bleeding, tissue injury and further surgery. We discussed the low dose of radioactivity involved in the procedure. Informed, written consent was given. The usual time-out protocol was performed immediately prior to the procedure. Heart Clip. Using mammographic guidance, sterile  technique, 1% lidocaine and an I-125 radioactive seed, the heart shaped post biopsy marker clip in the retroareolar breast was localized using a superior approach. The follow-up mammogram images confirm the seed in the expected location and were marked for Dr. Carolynne Edouard. Follow-up survey of the patient confirms presence of the radioactive seed. Order number of I-125 seed:  [id number]. Total activity:  0.258 millicuries.  Reference Date: 04/06/2023 Dumbbell clip. Using mammographic  guidance, sterile technique, 1% lidocaine and an I-125 radioactive seed, the dumbbell shaped post biopsy marker clip was localized using a superior approach. The follow-up mammogram images confirm the seed in the expected location and were marked for Dr. Carolynne Edouard. Follow-up survey of the patient confirms presence of the radioactive seed. Order number of I-125 seed:  [id number]. Total activity:  0.261 millicuries.  Reference Date: 04/03/2023 Cylinder clip. Using mammographic guidance, sterile technique, 1% lidocaine and an I-125 radioactive seed, cylinder shaped post biopsy marker clip was localized using a superior approach. The follow-up mammogram images confirm the seed in the expected location and were marked for Dr. Carolynne Edouard. Follow-up survey of the patient confirms presence of the radioactive seed. Order number of I-125 seed:  [id number]. Total activity:  0.261 millicuries.  Reference Date: 04/03/2023 Top hat clip. Using mammographic guidance, sterile technique, 1% lidocaine and an I-125 radioactive seed, the top hat shaped was localized using a superior approach. The follow-up mammogram images confirm the seed in the expected location and were marked for Dr. Carolynne Edouard. Follow-up survey of the patient confirms presence of the radioactive seed. Order number of I-125 seed:  [id number]. Total activity:  0.258 millicuries.  Reference Date: 04/06/2023 The patient tolerated the procedure well and was released from the [facility]. She was given instructions  regarding seed removal. IMPRESSION: Radioactive seed localization the right breast. Four seeds placed localizing 4 post biopsy marker clips. No apparent complications. Electronically Signed   By: Amie Portland M.D.   On: 04/30/2023 14:28  MM RT RADIO SEED EA ADD LESION LOC MAMMO  Result Date: 04/30/2023 CLINICAL DATA:  Patient presents for radioactive seed localization of 4 recently biopsied right breast lesions, which are in close proximity in the anterior to middle right breast. Three biopsies were performed under MRI guidance, 1 under ultrasound guidance. EXAM: MAMMOGRAPHIC GUIDED RADIOACTIVE SEED LOCALIZATION OF THE RIGHT BREAST: 4 SEEDS PLACED TO LOCALIZE 4 POST BIOPSY MARKER CLIPS. COMPARISON:  Previous exam(s). FINDINGS: Patient presents for radioactive seed localization prior to surgical excision. I met with the patient and we discussed the procedure of seed localization including benefits and alternatives. We discussed the high likelihood of a successful procedure. We discussed the risks of the procedure including infection, bleeding, tissue injury and further surgery. We discussed the low dose of radioactivity involved in the procedure. Informed, written consent was given. The usual time-out protocol was performed immediately prior to the procedure. Heart Clip. Using mammographic guidance, sterile technique, 1% lidocaine and an I-125 radioactive seed, the heart shaped post biopsy marker clip in the retroareolar breast was localized using a superior approach. The follow-up mammogram images confirm the seed in the expected location and were marked for Dr. Carolynne Edouard. Follow-up survey of the patient confirms presence of the radioactive seed. Order number of I-125 seed:  [id number]. Total activity:  0.258 millicuries.  Reference Date: 04/06/2023 Dumbbell clip. Using mammographic guidance, sterile technique, 1% lidocaine and an I-125 radioactive seed, the dumbbell shaped post biopsy marker clip was localized using  a superior approach. The follow-up mammogram images confirm the seed in the expected location and were marked for Dr. Carolynne Edouard. Follow-up survey of the patient confirms presence of the radioactive seed. Order number of I-125 seed:  [id number]. Total activity:  0.261 millicuries.  Reference Date: 04/03/2023 Cylinder clip. Using mammographic guidance, sterile technique, 1% lidocaine and an I-125 radioactive seed, cylinder shaped post biopsy marker clip was localized using a superior approach. The follow-up mammogram images confirm the seed in the expected location  and were marked for Dr. Carolynne Edouard. Follow-up survey of the patient confirms presence of the radioactive seed. Order number of I-125 seed:  [id number]. Total activity:  0.261 millicuries.  Reference Date: 04/03/2023 Top hat clip. Using mammographic guidance, sterile technique, 1% lidocaine and an I-125 radioactive seed, the top hat shaped was localized using a superior approach. The follow-up mammogram images confirm the seed in the expected location and were marked for Dr. Carolynne Edouard. Follow-up survey of the patient confirms presence of the radioactive seed. Order number of I-125 seed:  [id number]. Total activity:  0.258 millicuries.  Reference Date: 04/06/2023 The patient tolerated the procedure well and was released from the [facility]. She was given instructions regarding seed removal. IMPRESSION: Radioactive seed localization the right breast. Four seeds placed localizing 4 post biopsy marker clips. No apparent complications. Electronically Signed   By: Amie Portland M.D.   On: 04/30/2023 14:28  MM RT RADIO SEED EA ADD LESION LOC MAMMO  Result Date: 04/30/2023 CLINICAL DATA:  Patient presents for radioactive seed localization of 4 recently biopsied right breast lesions, which are in close proximity in the anterior to middle right breast. Three biopsies were performed under MRI guidance, 1 under ultrasound guidance. EXAM: MAMMOGRAPHIC GUIDED RADIOACTIVE SEED  LOCALIZATION OF THE RIGHT BREAST: 4 SEEDS PLACED TO LOCALIZE 4 POST BIOPSY MARKER CLIPS. COMPARISON:  Previous exam(s). FINDINGS: Patient presents for radioactive seed localization prior to surgical excision. I met with the patient and we discussed the procedure of seed localization including benefits and alternatives. We discussed the high likelihood of a successful procedure. We discussed the risks of the procedure including infection, bleeding, tissue injury and further surgery. We discussed the low dose of radioactivity involved in the procedure. Informed, written consent was given. The usual time-out protocol was performed immediately prior to the procedure. Heart Clip. Using mammographic guidance, sterile technique, 1% lidocaine and an I-125 radioactive seed, the heart shaped post biopsy marker clip in the retroareolar breast was localized using a superior approach. The follow-up mammogram images confirm the seed in the expected location and were marked for Dr. Carolynne Edouard. Follow-up survey of the patient confirms presence of the radioactive seed. Order number of I-125 seed:  [id number]. Total activity:  0.258 millicuries.  Reference Date: 04/06/2023 Dumbbell clip. Using mammographic guidance, sterile technique, 1% lidocaine and an I-125 radioactive seed, the dumbbell shaped post biopsy marker clip was localized using a superior approach. The follow-up mammogram images confirm the seed in the expected location and were marked for Dr. Carolynne Edouard. Follow-up survey of the patient confirms presence of the radioactive seed. Order number of I-125 seed:  [id number]. Total activity:  0.261 millicuries.  Reference Date: 04/03/2023 Cylinder clip. Using mammographic guidance, sterile technique, 1% lidocaine and an I-125 radioactive seed, cylinder shaped post biopsy marker clip was localized using a superior approach. The follow-up mammogram images confirm the seed in the expected location and were marked for Dr. Carolynne Edouard. Follow-up survey  of the patient confirms presence of the radioactive seed. Order number of I-125 seed:  [id number]. Total activity:  0.261 millicuries.  Reference Date: 04/03/2023 Top hat clip. Using mammographic guidance, sterile technique, 1% lidocaine and an I-125 radioactive seed, the top hat shaped was localized using a superior approach. The follow-up mammogram images confirm the seed in the expected location and were marked for Dr. Carolynne Edouard. Follow-up survey of the patient confirms presence of the radioactive seed. Order number of I-125 seed:  [id number]. Total activity:  0.258 millicuries.  Reference Date: 04/06/2023 The patient  tolerated the procedure well and was released from the [facility]. She was given instructions regarding seed removal. IMPRESSION: Radioactive seed localization the right breast. Four seeds placed localizing 4 post biopsy marker clips. No apparent complications. Electronically Signed   By: Amie Portland M.D.   On: 04/30/2023 14:28   All questions were answered. The patient knows to call the clinic with any problems, questions or concerns. I spent 45 minutes in the care of this patient including H and P, review of records, counseling and coordination of care.     Rachel Moulds, MD 05/19/2023 12:09 PM

## 2023-06-12 DIAGNOSIS — M79672 Pain in left foot: Secondary | ICD-10-CM | POA: Diagnosis not present

## 2023-06-23 DIAGNOSIS — M25672 Stiffness of left ankle, not elsewhere classified: Secondary | ICD-10-CM | POA: Diagnosis not present

## 2023-06-23 DIAGNOSIS — M25572 Pain in left ankle and joints of left foot: Secondary | ICD-10-CM | POA: Diagnosis not present

## 2023-07-21 DIAGNOSIS — M25672 Stiffness of left ankle, not elsewhere classified: Secondary | ICD-10-CM | POA: Diagnosis not present

## 2023-07-21 DIAGNOSIS — M25572 Pain in left ankle and joints of left foot: Secondary | ICD-10-CM | POA: Diagnosis not present

## 2023-07-23 ENCOUNTER — Other Ambulatory Visit (HOSPITAL_COMMUNITY): Payer: Self-pay

## 2023-07-23 DIAGNOSIS — K900 Celiac disease: Secondary | ICD-10-CM | POA: Diagnosis not present

## 2023-07-23 DIAGNOSIS — Z800 Family history of malignant neoplasm of digestive organs: Secondary | ICD-10-CM | POA: Diagnosis not present

## 2023-07-23 DIAGNOSIS — K219 Gastro-esophageal reflux disease without esophagitis: Secondary | ICD-10-CM | POA: Diagnosis not present

## 2023-07-23 DIAGNOSIS — Z1211 Encounter for screening for malignant neoplasm of colon: Secondary | ICD-10-CM | POA: Diagnosis not present

## 2023-07-23 MED ORDER — GOLYTELY 236 G PO SOLR
ORAL | 0 refills | Status: AC
  Filled 2023-07-23: qty 4000, 1d supply, fill #0

## 2023-07-25 ENCOUNTER — Other Ambulatory Visit (HOSPITAL_COMMUNITY): Payer: Self-pay

## 2023-07-25 MED FILL — Amlodipine Besylate Tab 2.5 MG (Base Equivalent): ORAL | 90 days supply | Qty: 90 | Fill #1 | Status: CN

## 2023-07-27 ENCOUNTER — Other Ambulatory Visit: Payer: Self-pay

## 2023-08-05 ENCOUNTER — Other Ambulatory Visit (HOSPITAL_COMMUNITY): Payer: Self-pay

## 2023-08-06 ENCOUNTER — Other Ambulatory Visit (HOSPITAL_COMMUNITY): Payer: Self-pay

## 2023-08-06 ENCOUNTER — Encounter: Payer: Self-pay | Admitting: Cardiology

## 2023-08-21 ENCOUNTER — Other Ambulatory Visit (HOSPITAL_COMMUNITY): Payer: Self-pay

## 2023-08-21 ENCOUNTER — Other Ambulatory Visit: Payer: Self-pay

## 2023-08-21 MED ORDER — GOLYTELY 236 G PO SOLR
ORAL | 0 refills | Status: AC
  Filled 2023-08-21: qty 4000, 1d supply, fill #0

## 2023-08-24 ENCOUNTER — Ambulatory Visit: Payer: Self-pay | Admitting: Cardiology

## 2023-08-28 ENCOUNTER — Encounter: Payer: Self-pay | Admitting: Cardiology

## 2023-09-03 ENCOUNTER — Other Ambulatory Visit (HOSPITAL_COMMUNITY): Payer: Self-pay

## 2023-09-22 ENCOUNTER — Other Ambulatory Visit (HOSPITAL_COMMUNITY): Payer: Self-pay

## 2023-09-22 MED ORDER — MELOXICAM 7.5 MG PO TABS
7.5000 mg | ORAL_TABLET | Freq: Two times a day (BID) | ORAL | 0 refills | Status: AC
  Filled 2023-09-22: qty 60, 30d supply, fill #0

## 2023-10-02 ENCOUNTER — Other Ambulatory Visit (HOSPITAL_COMMUNITY): Payer: Self-pay

## 2023-10-14 ENCOUNTER — Other Ambulatory Visit (HOSPITAL_COMMUNITY): Payer: Self-pay

## 2023-11-16 ENCOUNTER — Ambulatory Visit: Payer: Self-pay | Admitting: Genetic Counselor

## 2023-11-16 DIAGNOSIS — Z1379 Encounter for other screening for genetic and chromosomal anomalies: Secondary | ICD-10-CM

## 2023-11-16 NOTE — Progress Notes (Signed)
HPI:  Ms. Neuffer was previously seen in the [facility] due to a family history of cancer and concerns regarding a hereditary predisposition to cancer. Please refer to our prior cancer genetics clinic note for more information regarding our discussion, assessment and recommendations, at the time. Ms. Brull's recent genetic test results were disclosed to her, as were recommendations warranted by these results. These results and recommendations are discussed in more detail below.  CANCER HISTORY:  Oncology History   No history exists.    FAMILY HISTORY:  We obtained a detailed, 4-generation family history.  Significant diagnoses are listed below: Family History  Problem Relation Age of Onset   Hyperlipidemia Mother    Hypertension Mother    Heart attack Father    CAD Father    Colon cancer Father 5       metastatic in early 53s   Hypertension Brother    Breast cancer Maternal Grandmother        unsure if breast cancer vs mastitis, vs something else   Heart disease Maternal Grandmother 49   Stomach cancer Paternal Grandmother        The patient has two children who are cancer free.  She has a brother who is cancer free.  Her father is deceased and her mother is living.   The patient's father had colon cancer at 80 and died at 4.  His father died in WWII and his mother died of stomach cancer.   The patient's mother is living.  She had three brothers and a sister who were cancer free.  The maternal grandmother may have had breast cancer, or possibly some other breast disease.  She died of heart disease at 60.   Ms. Pinyan is unaware of previous family history of genetic testing for hereditary cancer risks. Patient's maternal ancestors are of [place], [place], [place] and [place] descent, and paternal ancestors are of [place] and [place] descent. There is no reported Ashkenazi Jewish ancestry. There is no known consanguinity.  GENETIC TEST RESULTS: Genetic  testing reported out on April 23, 2023 through the CancerNext-Expanded+RNAinsight cancer panel found no pathogenic mutations. The CancerNext-Expanded gene panel offered by [employer] and includes sequencing and rearrangement analysis for the following 71 genes: AIP, ALK, APC, ATM, BAP1, BARD1, BMPR1A, BRCA1, BRCA2, BRIP1, CDC73, CDH1, CDK4, CDKN1B, CDKN2A, CHEK2, DICER1, FH, FLCN, KIF1B, LZTR1, MAX, MEN1, MET, MLH1, MSH2, MSH6, MUTYH, NF1, NF2, NTHL1, PALB2, PHOX2B, PMS2, POT1, PRKAR1A, PTCH1, PTEN, RAD51C, RAD51D, RB1, RET, SDHA, SDHAF2, SDHB, SDHC, SDHD, SMAD4, SMARCA4, SMARCB1, SMARCE1, STK11, SUFU, TMEM127, TP53, TSC1, TSC2 and VHL (sequencing and deletion/duplication); AXIN2, CTNNA1, EGFR, EGLN1, HOXB13, KIT, MITF, MSH3, PDGFRA, POLD1 and POLE (sequencing only); EPCAM and GREM1 (deletion/duplication only). RNA data is routinely analyzed for use in variant interpretation for all genes. The test report has been scanned into EPIC and is located under the Molecular Pathology section of the Results Review tab.  A portion of the result report is included below for reference.     We discussed with Ms. Barten that because current genetic testing is not perfect, it is possible there may be a gene mutation in one of these genes that current testing cannot detect, but that chance is small.  We also discussed, that there could be another gene that has not yet been discovered, or that we have not yet tested, that is responsible for the cancer diagnoses in the family. It is also possible there is a hereditary cause for the cancer in  the family that Ms. Rusk did not inherit and therefore was not identified in her testing.  Therefore, it is important to remain in touch with cancer genetics in the future so that we can continue to offer Ms. Hermosillo the most up to date genetic testing.   ADDITIONAL GENETIC TESTING: We discussed with Ms. Bolduc that her genetic testing was fairly extensive.  If there are genes  identified to increase cancer risk that can be analyzed in the future, we would be happy to discuss and coordinate this testing at that time.    CANCER SCREENING RECOMMENDATIONS: Ms. Wrisley test result is considered negative (normal).  This means that we have not identified a hereditary cause for her family history of cancer at this time. Most cancers happen by chance and this negative test suggests that her family history of cancer may fall into this category.    Possible reasons for Ms. Dudzinski's negative genetic test include:  1. There may be a gene mutation in one of these genes that current testing methods cannot detect but that chance is small.  2. There could be another gene that has not yet been discovered, or that we have not yet tested, that is responsible for the cancer diagnoses in the family.  3.  There may be no hereditary risk for cancer in the family. The cancers in Ms. Gourd and/or her family may be sporadic/familial or due to other genetic and environmental factors. 4. It is also possible there is a hereditary cause for the cancer in the family that Ms. Masino did not inherit.  Therefore, it is recommended she continue to follow the cancer management and screening guidelines provided by her primary healthcare provider. An individual's cancer risk and medical management are not determined by genetic test results alone. Overall cancer risk assessment incorporates additional factors, including personal medical history, family history, and any available genetic information that may result in a personalized plan for cancer prevention and surveillance   RECOMMENDATIONS FOR FAMILY MEMBERS:  Individuals in this family might be at some increased risk of developing cancer, over the general population risk, simply due to the family history of cancer.  We recommended women in this family have a yearly mammogram beginning at age 4, or 16 years younger than the earliest onset of cancer, an  annual clinical breast exam, and perform monthly breast self-exams. Women in this family should also have a gynecological exam as recommended by their primary provider. All family members should be referred for colonoscopy starting at age 59, or 45 years younger than the earliest onset of cancer.  FOLLOW-UP: Lastly, we discussed with Ms. Sthilaire that cancer genetics is a rapidly advancing field and it is possible that new genetic tests will be appropriate for her and/or her family members in the future. We encouraged her to remain in contact with cancer genetics on an annual basis so we can update her personal and family histories and let her know of advances in cancer genetics that may benefit this family.   Our contact number was provided. Ms. Pettibone's questions were answered to her satisfaction, and she knows she is welcome to call us at anytime with additional questions or concerns.   Maylon Cos, MS, [facility] Licensed, Certified Genetic Counselor [email or web address]

## 2023-12-26 ENCOUNTER — Emergency Department (HOSPITAL_BASED_OUTPATIENT_CLINIC_OR_DEPARTMENT_OTHER): Payer: 59

## 2023-12-26 ENCOUNTER — Emergency Department (HOSPITAL_BASED_OUTPATIENT_CLINIC_OR_DEPARTMENT_OTHER)
Admission: EM | Admit: 2023-12-26 | Discharge: 2023-12-26 | Disposition: A | Discharge status: Home / Self Care | Payer: 59

## 2023-12-26 ENCOUNTER — Encounter (HOSPITAL_BASED_OUTPATIENT_CLINIC_OR_DEPARTMENT_OTHER): Payer: Self-pay | Admitting: Emergency Medicine

## 2023-12-26 ENCOUNTER — Other Ambulatory Visit: Payer: Self-pay

## 2023-12-26 DIAGNOSIS — R112 Nausea with vomiting, unspecified: Secondary | ICD-10-CM | POA: Diagnosis not present

## 2023-12-26 DIAGNOSIS — Z7982 Long term (current) use of aspirin: Secondary | ICD-10-CM | POA: Insufficient documentation

## 2023-12-26 DIAGNOSIS — R1030 Lower abdominal pain, unspecified: Secondary | ICD-10-CM

## 2023-12-26 DIAGNOSIS — Z79899 Other long term (current) drug therapy: Secondary | ICD-10-CM | POA: Insufficient documentation

## 2023-12-26 HISTORY — DX: Acute coronary microvascular dysfunction: I24.81

## 2023-12-26 LAB — URINALYSIS, ROUTINE W REFLEX MICROSCOPIC
Bacteria, UA: NONE SEEN
Bilirubin Urine: NEGATIVE
Glucose, UA: NEGATIVE mg/dL
Hgb urine dipstick: NEGATIVE
Ketones, ur: 15 mg/dL — AB
Leukocytes,Ua: NEGATIVE
Nitrite: NEGATIVE
Protein, ur: 30 mg/dL — AB
Specific Gravity, Urine: >1.046 — ABNORMAL HIGH (ref 1.005–1.030)
pH: 7 (ref 5.0–8.0)

## 2023-12-26 LAB — CBC WITH DIFFERENTIAL/PLATELET
Abs Immature Granulocytes: 0.03 10*3/uL (ref 0.00–0.07)
Basophils Absolute: 0 10*3/uL (ref 0.0–0.1)
Basophils Relative: 0 %
Eosinophils Absolute: 0 10*3/uL (ref 0.0–0.5)
Eosinophils Relative: 0 %
HCT: 39.6 % (ref 36.0–46.0)
Hemoglobin: 13.7 g/dL (ref 12.0–15.0)
Immature Granulocytes: 0 %
Lymphocytes Relative: 11 %
Lymphs Abs: 1.1 10*3/uL (ref 0.7–4.0)
MCH: 30.9 pg (ref 26.0–34.0)
MCHC: 34.6 g/dL (ref 30.0–36.0)
MCV: 89.4 fL (ref 80.0–100.0)
Monocytes Absolute: 0.6 10*3/uL (ref 0.1–1.0)
Monocytes Relative: 6 %
Neutro Abs: 7.8 10*3/uL — ABNORMAL HIGH (ref 1.7–7.7)
Neutrophils Relative %: 83 %
Platelets: 232 10*3/uL (ref 150–400)
RBC: 4.43 MIL/uL (ref 3.87–5.11)
RDW: 12.2 % (ref 11.5–15.5)
WBC: 9.6 10*3/uL (ref 4.0–10.5)
nRBC: 0 % (ref 0.0–0.2)

## 2023-12-26 LAB — COMPREHENSIVE METABOLIC PANEL
ALT: 28 U/L (ref 0–44)
AST: 18 U/L (ref 15–41)
Albumin: 4.3 g/dL (ref 3.5–5.0)
Alkaline Phosphatase: 74 U/L (ref 38–126)
Anion gap: 8 (ref 5–15)
BUN: 20 mg/dL (ref 6–20)
CO2: 25 mmol/L (ref 22–32)
Calcium: 9.5 mg/dL (ref 8.9–10.3)
Chloride: 108 mmol/L (ref 98–111)
Creatinine, Ser: 0.98 mg/dL (ref 0.44–1.00)
GFR, Estimated: >60 mL/min (ref >60)
Glucose, Bld: 98 mg/dL (ref 70–99)
Potassium: 4.2 mmol/L (ref 3.5–5.1)
Sodium: 141 mmol/L (ref 135–145)
Total Bilirubin: 0.9 mg/dL (ref <1.2)
Total Protein: 7 g/dL (ref 6.5–8.1)

## 2023-12-26 LAB — LIPASE, BLOOD: Lipase: <10 U/L — ABNORMAL LOW (ref 11–51)

## 2023-12-26 MED ORDER — ACETAMINOPHEN 500 MG PO TABS: 1000.0000 mg | ORAL_TABLET | Freq: Once | ORAL | Status: DC

## 2023-12-26 MED ORDER — ONDANSETRON 4 MG PO TBDP: 4.0000 mg | ORAL_TABLET | Freq: Three times a day (TID) | ORAL | 0 refills | Status: DC | PRN

## 2023-12-26 MED ORDER — HYDROMORPHONE HCL 1 MG/ML IJ SOLN
1.0000 mg | Freq: Once | INTRAMUSCULAR | Status: AC
  Administered 2023-12-26: 1 mg via INTRAVENOUS
  Filled 2023-12-26: qty 1

## 2023-12-26 MED ORDER — IOHEXOL 300 MG/ML  SOLN
80.0000 mL | Freq: Once | INTRAMUSCULAR | Status: AC | PRN
  Administered 2023-12-26: 80 mL via INTRAVENOUS

## 2023-12-26 MED ORDER — ONDANSETRON HCL 4 MG/2ML IJ SOLN
4.0000 mg | Freq: Once | INTRAMUSCULAR | Status: AC
  Administered 2023-12-26: 4 mg via INTRAVENOUS
  Filled 2023-12-26: qty 2

## 2023-12-26 MED ORDER — KETOROLAC TROMETHAMINE 15 MG/ML IJ SOLN: 30.0000 mg | Freq: Once | INTRAMUSCULAR | Status: DC

## 2023-12-26 MED ORDER — PROMETHAZINE HCL 12.5 MG RE SUPP: 25.0000 mg | Freq: Three times a day (TID) | RECTAL | 0 refills | Status: AC | PRN

## 2023-12-26 MED ORDER — DEXAMETHASONE SODIUM PHOSPHATE 10 MG/ML IJ SOLN: 20.0000 mg | Freq: Once | INTRAMUSCULAR | Status: DC

## 2023-12-26 NOTE — ED Triage Notes (Signed)
Left lower quad,center sharp pain ,n/v/ on wegoby and think that is the culprit, zofran at 1200, saline enema at 1 with no results. Is not passing gas, no bm.since 7 days.

## 2023-12-26 NOTE — ED Notes (Signed)
Pt aware of need for UA. Unable to void at this time.

## 2023-12-26 NOTE — Discharge Instructions (Addendum)
Thank you for letting us evaluate you today.  Your CT scan was negative for obstruction or any acute intra-abdominal pathology.  Please follow-up with your PCP regarding further management.  Return to emergency department if you experience significant worsening of symptoms

## 2023-12-26 NOTE — ED Provider Notes (Signed)
[place] EMERGENCY DEPARTMENT AT [facility] Provider Note   CSN: [id number] Arrival date & time: 12/26/23  1338     History {Add pertinent medical, surgical, social history, OB history to HPI:1} No chief complaint on file.   Tricia Atkins is a 57 y.o. female with past medical history of HLD, GERD presents to emergency department for evaluation of nausea, vomiting, lower abdominal pain that started last week.  She reports that last bowel movement was 7 days ago and was "small marbles".  She reports abdominal pain is 6 out of 10 with intermittent sharp 10 out of 10 pain.  She has had vomited 3 times today.  She denies flatulence but is passing mucus.  She has been on [facility] for the past 3 months and attributes her symptoms to this.  She has tried Tylenol, Emina, Zofran, and digital disimpaction at home without relief.  She denies fevers.    HPI    Home Medications Prior to Admission medications   Medication Sig Start Date End Date Taking? Authorizing Provider  acetaminophen (TYLENOL) 500 MG tablet Take 500 mg by mouth every 6 (six) hours as needed.    [provider]  amLODipine (NORVASC) 2.5 MG tablet Take 1 tablet (2.5 mg total) by mouth daily. 03/15/23   Patwardhan, Anabel Bene, MD  aspirin EC (ASPIRIN LOW DOSE) 81 MG tablet Take 1 tablet (81 mg total) by mouth 2 (two) times daily for 60 days 05/12/23     docusate sodium (COLACE) 100 MG capsule Take 1 capsule twice a day by oral route for 28 days. 05/12/23     meloxicam (MOBIC) 7.5 MG tablet Take 1 tablet (7.5 mg total) by mouth 2 (two) times daily for 2 weeks, then take as needed 09/22/23     nitroGLYCERIN (NITROSTAT) 0.4 MG SL tablet DISSOLVE 1 TABLET UNDER THE TONGUE EVERY 5 MINUTES FOR UP TO 3 DOSES AS NEEDED FOR CHEST PAIN 07/10/22   Patwardhan, Anabel Bene, MD  omeprazole (PRILOSEC) 10 MG capsule Take 1 capsule (10 mg total) by mouth daily. Patient taking differently: Take 20 mg by mouth daily. 06/20/22   Patwardhan,  Manish J, MD  ondansetron (ZOFRAN) 4 MG tablet Take 1 tablet (4 mg total) by mouth 2 (two) times daily as needed. 05/12/23     oxyCODONE (OXY IR/ROXICODONE) 5 MG immediate release tablet Take 1 tablet (5 mg total) by mouth every 4-6 hours 05/12/23     polyethylene glycol (GOLYTELY) 236 g solution Use as directed. 07/23/23     polyethylene glycol (GOLYTELY) 236 g solution Use as directed 08/21/23     rosuvastatin (CRESTOR) 20 MG tablet Take 1 tablet (20 mg total) by mouth daily. 05/08/22         Allergies    Gluten meal    Review of Systems   Review of Systems  Constitutional:  Negative for chills, fatigue and fever.  Respiratory:  Negative for cough, chest tightness, shortness of breath and wheezing.   Cardiovascular:  Negative for chest pain and palpitations.  Gastrointestinal:  Positive for abdominal pain, constipation, diarrhea and nausea. Negative for vomiting.  Neurological:  Negative for dizziness, seizures, weakness, light-headedness, numbness and headaches.    Physical Exam Updated Vital Signs BP 108/72 (BP Location: Left Arm)   Pulse 90   Temp 98.4 F (36.9 C)   Resp 19   Wt 63.5 kg   SpO2 100%   BMI 24.03 kg/m  Physical Exam Vitals and nursing note reviewed.  Constitutional:  General: She is not in acute distress.    Appearance: Normal appearance.  HENT:     Head: Normocephalic and atraumatic.  Eyes:     Conjunctiva/sclera: Conjunctivae normal.  Cardiovascular:     Rate and Rhythm: Normal rate.  Pulmonary:     Effort: Pulmonary effort is normal. No respiratory distress.     Breath sounds: Normal breath sounds.  Abdominal:     General: Bowel sounds are decreased.     Palpations: Abdomen is soft.     Tenderness: There is abdominal tenderness in the right lower quadrant and left lower quadrant. There is guarding. There is no right CVA tenderness or left CVA tenderness.  Musculoskeletal:     Right lower leg: No edema.     Left lower leg: No edema.  Skin:     Capillary Refill: Capillary refill takes less than 2 seconds.     Coloration: Skin is not jaundiced or pale.  Neurological:     Mental Status: She is alert and oriented to person, place, and time. Mental status is at baseline.     ED Results / Procedures / Treatments   Labs (all labs ordered are listed, but only abnormal results are displayed) Labs Reviewed - No data to display  EKG None  Radiology No results found.  Procedures Procedures  {Document cardiac monitor, telemetry assessment procedure when appropriate:1}  Medications Ordered in ED Medications - No data to display  ED Course/ Medical Decision Making/ A&P   {   Click here for ABCD2, HEART and other calculatorsREFRESH Note before signing :1}                              Medical Decision Making Amount and/or Complexity of Data Reviewed Labs: ordered. Radiology: ordered.  Risk Prescription drug management.     Patient presents to the ED for concern of ***, this involves an extensive number of treatment options, and is a complaint that carries with it a high risk of complications and morbidity.  The differential diagnosis includes ***   Co morbidities that complicate the patient evaluation  ***   Additional history obtained:  Additional history obtained from *** {Blank multiple:19196::"EMS","Family","Nursing","Outside Medical Records","Past Admission"}   External records from outside source obtained and reviewed including ***   Lab Tests:  I Ordered, and personally interpreted labs.  The pertinent results include:  ***   Imaging Studies ordered:  I ordered imaging studies including ***  I independently visualized and interpreted imaging which showed *** I agree with the radiologist interpretation   Cardiac Monitoring:  The patient was maintained on a cardiac monitor.  I personally viewed and interpreted the cardiac monitored which showed an underlying rhythm of: ***   Medicines ordered  and prescription drug management:  I ordered medication including ***  for ***  Reevaluation of the patient after these medicines showed that the patient {resolved/improved/worsened:23923::"improved"} I have reviewed the patients home medicines and have made adjustments as needed   Test Considered:  ***   Critical Interventions:  ***   Consultations Obtained:  I requested consultation with the ***,  and discussed lab and imaging findings as well as pertinent plan - they recommend: ***   Problem List / ED Course:  ***   Reevaluation:  After the interventions noted above, I reevaluated the patient and found that they have :{resolved/improved/worsened:23923::"improved"}   Social Determinants of Health:  ***   Dispostion:  After consideration of  the diagnostic results and the patients response to treatment, I feel that the patent would benefit from ***.    {Document critical care time when appropriate:1} {Document review of labs and clinical decision tools ie heart score, Chads2Vasc2 etc:1}  {Document your independent review of radiology images, and any outside records:1} {Document your discussion with family members, caretakers, and with consultants:1} {Document social determinants of health affecting pt's care:1} {Document your decision making why or why not admission, treatments were needed:1} Final Clinical Impression(s) / ED Diagnoses Final diagnoses:  None    Rx / DC Orders ED Discharge Orders     None
